# Patient Record
Sex: Male | Born: 1965
Health system: Southern US, Community
[De-identification: ages and names within clinical notes are randomized; demographics above are authoritative.]

## PROBLEM LIST (undated history)

## (undated) DIAGNOSIS — M5090 Cervical disc disorder, unspecified, unspecified cervical region: Secondary | ICD-10-CM

## (undated) DIAGNOSIS — K219 Gastro-esophageal reflux disease without esophagitis: Secondary | ICD-10-CM

## (undated) DIAGNOSIS — M47812 Spondylosis without myelopathy or radiculopathy, cervical region: Secondary | ICD-10-CM

## (undated) DIAGNOSIS — J309 Allergic rhinitis, unspecified: Secondary | ICD-10-CM

## (undated) DIAGNOSIS — E781 Pure hyperglyceridemia: Secondary | ICD-10-CM

## (undated) DIAGNOSIS — E291 Testicular hypofunction: Secondary | ICD-10-CM

## (undated) DIAGNOSIS — M549 Dorsalgia, unspecified: Secondary | ICD-10-CM

## (undated) DIAGNOSIS — E785 Hyperlipidemia, unspecified: Secondary | ICD-10-CM

## (undated) DIAGNOSIS — R51 Headache: Secondary | ICD-10-CM

## (undated) DIAGNOSIS — G8929 Other chronic pain: Secondary | ICD-10-CM

## (undated) HISTORY — DX: Dorsalgia, unspecified: M54.9

## (undated) HISTORY — PX: EPIDURAL BLOCK INJECTION: SHX1516

## (undated) HISTORY — DX: Headache: R51

## (undated) HISTORY — DX: Gastro-esophageal reflux disease without esophagitis: K21.9

## (undated) HISTORY — DX: Cervical disc disorder, unspecified, unspecified cervical region: M50.90

## (undated) HISTORY — PX: LUMBAR LAMINECTOMY: SHX95

## (undated) HISTORY — DX: Spondylosis without myelopathy or radiculopathy, cervical region: M47.812

## (undated) HISTORY — PX: LUMBAR FUSION: SHX111

## (undated) HISTORY — DX: Other chronic pain: G89.29

## (undated) HISTORY — DX: Pure hyperglyceridemia: E78.1

## (undated) HISTORY — DX: Hyperlipidemia, unspecified: E78.5

## (undated) HISTORY — DX: Allergic rhinitis, unspecified: J30.9

## (undated) HISTORY — DX: Testicular hypofunction: E29.1

---

## 1995-11-08 HISTORY — PX: MOUTH SURGERY: SHX715

## 1999-07-11 ENCOUNTER — Ambulatory Visit (HOSPITAL_COMMUNITY): Admission: RE | Admit: 1999-07-11 | Discharge: 1999-07-11 | Payer: Self-pay | Admitting: Neurosurgery

## 1999-07-11 ENCOUNTER — Encounter: Payer: Self-pay | Admitting: Neurosurgery

## 1999-07-29 ENCOUNTER — Encounter: Admission: RE | Admit: 1999-07-29 | Discharge: 1999-08-12 | Payer: Self-pay | Admitting: Internal Medicine

## 2000-06-10 ENCOUNTER — Encounter: Payer: Self-pay | Admitting: Neurosurgery

## 2000-06-10 ENCOUNTER — Ambulatory Visit (HOSPITAL_COMMUNITY): Admission: RE | Admit: 2000-06-10 | Discharge: 2000-06-10 | Payer: Self-pay | Admitting: Neurosurgery

## 2000-10-14 ENCOUNTER — Encounter: Payer: Self-pay | Admitting: Neurosurgery

## 2000-10-14 ENCOUNTER — Ambulatory Visit (HOSPITAL_COMMUNITY): Admission: RE | Admit: 2000-10-14 | Discharge: 2000-10-14 | Payer: Self-pay | Admitting: Neurosurgery

## 2001-08-13 ENCOUNTER — Emergency Department (HOSPITAL_COMMUNITY): Admission: EM | Admit: 2001-08-13 | Discharge: 2001-08-13 | Payer: Self-pay | Admitting: Emergency Medicine

## 2002-09-12 ENCOUNTER — Encounter: Payer: Self-pay | Admitting: *Deleted

## 2002-09-12 ENCOUNTER — Encounter: Admission: RE | Admit: 2002-09-12 | Discharge: 2002-09-12 | Payer: Self-pay | Admitting: *Deleted

## 2004-09-07 ENCOUNTER — Ambulatory Visit: Payer: Self-pay | Admitting: Internal Medicine

## 2004-09-28 ENCOUNTER — Ambulatory Visit: Payer: Self-pay | Admitting: Internal Medicine

## 2004-11-15 ENCOUNTER — Ambulatory Visit: Payer: Self-pay | Admitting: Internal Medicine

## 2005-01-11 ENCOUNTER — Ambulatory Visit: Payer: Self-pay | Admitting: Internal Medicine

## 2005-01-18 ENCOUNTER — Ambulatory Visit: Payer: Self-pay | Admitting: Internal Medicine

## 2005-08-16 ENCOUNTER — Ambulatory Visit: Payer: Self-pay | Admitting: Neurosurgery

## 2005-12-06 ENCOUNTER — Ambulatory Visit: Payer: Self-pay | Admitting: Internal Medicine

## 2006-01-30 ENCOUNTER — Ambulatory Visit: Payer: Self-pay | Admitting: Internal Medicine

## 2006-02-06 ENCOUNTER — Ambulatory Visit: Payer: Self-pay | Admitting: Internal Medicine

## 2007-02-08 ENCOUNTER — Ambulatory Visit: Payer: Self-pay | Admitting: Internal Medicine

## 2007-04-30 ENCOUNTER — Ambulatory Visit: Payer: Self-pay | Admitting: Internal Medicine

## 2007-05-29 ENCOUNTER — Telehealth: Payer: Self-pay | Admitting: *Deleted

## 2007-06-13 ENCOUNTER — Encounter: Payer: Self-pay | Admitting: Internal Medicine

## 2007-07-30 ENCOUNTER — Telehealth: Payer: Self-pay | Admitting: *Deleted

## 2007-11-26 ENCOUNTER — Ambulatory Visit: Payer: Self-pay | Admitting: Internal Medicine

## 2007-11-26 DIAGNOSIS — M5137 Other intervertebral disc degeneration, lumbosacral region: Secondary | ICD-10-CM | POA: Insufficient documentation

## 2007-11-26 DIAGNOSIS — M549 Dorsalgia, unspecified: Secondary | ICD-10-CM

## 2007-11-26 DIAGNOSIS — G8929 Other chronic pain: Secondary | ICD-10-CM | POA: Insufficient documentation

## 2007-11-26 DIAGNOSIS — N200 Calculus of kidney: Secondary | ICD-10-CM | POA: Insufficient documentation

## 2007-11-26 DIAGNOSIS — E785 Hyperlipidemia, unspecified: Secondary | ICD-10-CM | POA: Insufficient documentation

## 2007-11-26 LAB — CONVERTED CEMR LAB
Bilirubin Urine: NEGATIVE
Glucose, Urine, Semiquant: NEGATIVE
Ketones, urine, test strip: NEGATIVE
Nitrite: NEGATIVE
Specific Gravity, Urine: >=1.03
Urobilinogen, UA: 0.2
WBC Urine, dipstick: NEGATIVE
pH: 5.5

## 2007-11-28 ENCOUNTER — Ambulatory Visit: Payer: Self-pay | Admitting: Cardiovascular Disease

## 2007-11-29 ENCOUNTER — Telehealth: Payer: Self-pay | Admitting: *Deleted

## 2007-11-29 ENCOUNTER — Encounter: Payer: Self-pay | Admitting: Internal Medicine

## 2007-11-30 ENCOUNTER — Telehealth (INDEPENDENT_AMBULATORY_CARE_PROVIDER_SITE_OTHER): Payer: Self-pay | Admitting: *Deleted

## 2007-12-18 ENCOUNTER — Encounter: Payer: Self-pay | Admitting: Internal Medicine

## 2008-02-25 ENCOUNTER — Telehealth (INDEPENDENT_AMBULATORY_CARE_PROVIDER_SITE_OTHER): Payer: Self-pay | Admitting: *Deleted

## 2008-05-26 ENCOUNTER — Telehealth: Payer: Self-pay | Admitting: Internal Medicine

## 2008-08-05 ENCOUNTER — Telehealth: Payer: Self-pay | Admitting: Internal Medicine

## 2008-08-19 ENCOUNTER — Ambulatory Visit: Payer: Self-pay | Admitting: Internal Medicine

## 2008-08-19 LAB — CONVERTED CEMR LAB
ALT: 29 units/L (ref 0–53)
AST: 25 units/L (ref 0–37)
Albumin: 3.8 g/dL (ref 3.5–5.2)
Alkaline Phosphatase: 67 units/L (ref 39–117)
BUN: 14 mg/dL (ref 6–23)
Basophils Absolute: 0 10*3/uL (ref 0.0–0.1)
Basophils Relative: 0.2 % (ref 0.0–3.0)
Bilirubin Urine: NEGATIVE
Bilirubin, Direct: 0.1 mg/dL (ref 0.0–0.3)
Blood in Urine, dipstick: NEGATIVE
CO2: 34 meq/L — ABNORMAL HIGH (ref 19–32)
Calcium: 9.2 mg/dL (ref 8.4–10.5)
Chloride: 102 meq/L (ref 96–112)
Cholesterol: 193 mg/dL (ref 0–200)
Creatinine, Ser: 0.8 mg/dL (ref 0.4–1.5)
Direct LDL: 60.5 mg/dL
Eosinophils Absolute: 0.1 10*3/uL (ref 0.0–0.7)
Eosinophils Relative: 2.2 % (ref 0.0–5.0)
GFR calc Af Amer: 135 mL/min
GFR calc non Af Amer: 112 mL/min
Glucose, Bld: 132 mg/dL — ABNORMAL HIGH (ref 70–99)
Glucose, Urine, Semiquant: NEGATIVE
HCT: 35.9 % — ABNORMAL LOW (ref 39.0–52.0)
HDL: 24.9 mg/dL — ABNORMAL LOW (ref >39.0)
Hemoglobin: 12.3 g/dL — ABNORMAL LOW (ref 13.0–17.0)
Lymphocytes Relative: 33.2 % (ref 12.0–46.0)
MCHC: 34.2 g/dL (ref 30.0–36.0)
MCV: 91.3 fL (ref 78.0–100.0)
Monocytes Absolute: 0.5 10*3/uL (ref 0.1–1.0)
Monocytes Relative: 7.8 % (ref 3.0–12.0)
Neutro Abs: 3.4 10*3/uL (ref 1.4–7.7)
Neutrophils Relative %: 56.6 % (ref 43.0–77.0)
Nitrite: NEGATIVE
Platelets: 205 10*3/uL (ref 150–400)
Potassium: 3.7 meq/L (ref 3.5–5.1)
Protein, U semiquant: NEGATIVE
RBC: 3.93 M/uL — ABNORMAL LOW (ref 4.22–5.81)
RDW: 13.7 % (ref 11.5–14.6)
Sodium: 143 meq/L (ref 135–145)
Specific Gravity, Urine: 1.02
TSH: 1.32 microintl units/mL (ref 0.35–5.50)
Total Bilirubin: 0.4 mg/dL (ref 0.3–1.2)
Total CHOL/HDL Ratio: 7.8
Total Protein: 6.8 g/dL (ref 6.0–8.3)
Triglycerides: 599 mg/dL (ref 0–149)
Urobilinogen, UA: 0.2
VLDL: 120 mg/dL — ABNORMAL HIGH (ref 0–40)
WBC Urine, dipstick: NEGATIVE
WBC: 6 10*3/uL (ref 4.5–10.5)
pH: 6

## 2008-08-26 ENCOUNTER — Ambulatory Visit: Payer: Self-pay | Admitting: Internal Medicine

## 2008-08-26 DIAGNOSIS — R51 Headache: Secondary | ICD-10-CM | POA: Insufficient documentation

## 2008-08-26 DIAGNOSIS — R519 Headache, unspecified: Secondary | ICD-10-CM | POA: Insufficient documentation

## 2008-11-18 ENCOUNTER — Ambulatory Visit: Payer: Self-pay | Admitting: Internal Medicine

## 2008-11-18 LAB — CONVERTED CEMR LAB
Basophils Absolute: 0.1 10*3/uL (ref 0.0–0.1)
Basophils Relative: 0.8 % (ref 0.0–3.0)
Cholesterol: 182 mg/dL (ref 0–200)
Direct LDL: 63.5 mg/dL
Eosinophils Absolute: 0.2 10*3/uL (ref 0.0–0.7)
Eosinophils Relative: 2.1 % (ref 0.0–5.0)
HCT: 38.2 % — ABNORMAL LOW (ref 39.0–52.0)
HDL: 21.7 mg/dL — ABNORMAL LOW (ref >39.0)
Hemoglobin: 13 g/dL (ref 13.0–17.0)
Lymphocytes Relative: 28.2 % (ref 12.0–46.0)
MCHC: 34 g/dL (ref 30.0–36.0)
MCV: 89.9 fL (ref 78.0–100.0)
Monocytes Absolute: 0.6 10*3/uL (ref 0.1–1.0)
Monocytes Relative: 8.4 % (ref 3.0–12.0)
Neutro Abs: 4.4 10*3/uL (ref 1.4–7.7)
Neutrophils Relative %: 60.5 % (ref 43.0–77.0)
Platelets: 209 10*3/uL (ref 150–400)
RBC: 4.25 M/uL (ref 4.22–5.81)
RDW: 13.2 % (ref 11.5–14.6)
Total CHOL/HDL Ratio: 8.4
Triglycerides: 546 mg/dL (ref 0–149)
VLDL: 109 mg/dL — ABNORMAL HIGH (ref 0–40)
WBC: 7.4 10*3/uL (ref 4.5–10.5)

## 2008-11-25 ENCOUNTER — Ambulatory Visit: Payer: Self-pay | Admitting: Internal Medicine

## 2008-11-25 DIAGNOSIS — E8881 Metabolic syndrome: Secondary | ICD-10-CM | POA: Insufficient documentation

## 2008-11-25 DIAGNOSIS — F17200 Nicotine dependence, unspecified, uncomplicated: Secondary | ICD-10-CM | POA: Insufficient documentation

## 2009-02-16 ENCOUNTER — Ambulatory Visit: Payer: Self-pay | Admitting: Internal Medicine

## 2009-02-16 LAB — CONVERTED CEMR LAB
ALT: 21 units/L (ref 0–53)
AST: 22 units/L (ref 0–37)
Albumin: 3.6 g/dL (ref 3.5–5.2)
Alkaline Phosphatase: 79 units/L (ref 39–117)
Bilirubin, Direct: 0 mg/dL (ref 0.0–0.3)
Cholesterol: 257 mg/dL — ABNORMAL HIGH (ref 0–200)
Direct LDL: 63.6 mg/dL
HDL: 14.3 mg/dL — ABNORMAL LOW (ref >39.00)
Total Bilirubin: 0.5 mg/dL (ref 0.3–1.2)
Total CHOL/HDL Ratio: 18
Total Protein: 6.8 g/dL (ref 6.0–8.3)
Triglycerides: 1433 mg/dL — ABNORMAL HIGH (ref 0.0–149.0)
VLDL: 286.6 mg/dL — ABNORMAL HIGH (ref 0.0–40.0)

## 2009-02-24 ENCOUNTER — Ambulatory Visit: Payer: Self-pay | Admitting: Internal Medicine

## 2009-02-24 DIAGNOSIS — K219 Gastro-esophageal reflux disease without esophagitis: Secondary | ICD-10-CM | POA: Insufficient documentation

## 2009-04-10 ENCOUNTER — Telehealth: Payer: Self-pay | Admitting: Internal Medicine

## 2009-05-20 ENCOUNTER — Ambulatory Visit: Payer: Self-pay | Admitting: Internal Medicine

## 2009-05-20 LAB — CONVERTED CEMR LAB
Bilirubin, Direct: 0 mg/dL (ref 0.0–0.3)
Direct LDL: 91.1 mg/dL
HDL: 25.9 mg/dL — ABNORMAL LOW (ref >39.00)
Total Bilirubin: 0.4 mg/dL (ref 0.3–1.2)
Total CHOL/HDL Ratio: 7
Triglycerides: 394 mg/dL — ABNORMAL HIGH (ref 0.0–149.0)
VLDL: 78.8 mg/dL — ABNORMAL HIGH (ref 0.0–40.0)

## 2009-05-27 ENCOUNTER — Ambulatory Visit: Payer: Self-pay | Admitting: Internal Medicine

## 2009-08-24 ENCOUNTER — Ambulatory Visit: Payer: Self-pay | Admitting: Internal Medicine

## 2009-08-24 LAB — CONVERTED CEMR LAB
CO2: 35 meq/L — ABNORMAL HIGH (ref 19–32)
Calcium: 9.2 mg/dL (ref 8.4–10.5)
Chloride: 102 meq/L (ref 96–112)
Cholesterol: 200 mg/dL (ref 0–200)
Direct LDL: 112.3 mg/dL
Glucose, Bld: 74 mg/dL (ref 70–99)
HDL: 26.6 mg/dL — ABNORMAL LOW (ref >39.00)
Potassium: 4.7 meq/L (ref 3.5–5.1)
Sodium: 144 meq/L (ref 135–145)

## 2009-11-18 ENCOUNTER — Ambulatory Visit: Payer: Self-pay | Admitting: Internal Medicine

## 2009-11-18 DIAGNOSIS — M1710 Unilateral primary osteoarthritis, unspecified knee: Secondary | ICD-10-CM | POA: Insufficient documentation

## 2009-11-18 DIAGNOSIS — L218 Other seborrheic dermatitis: Secondary | ICD-10-CM | POA: Insufficient documentation

## 2010-02-16 ENCOUNTER — Ambulatory Visit: Payer: Self-pay | Admitting: Internal Medicine

## 2010-05-18 ENCOUNTER — Ambulatory Visit: Payer: Self-pay | Admitting: Internal Medicine

## 2010-08-18 ENCOUNTER — Ambulatory Visit: Payer: Self-pay | Admitting: Internal Medicine

## 2010-08-18 LAB — CONVERTED CEMR LAB
BUN: 12 mg/dL (ref 6–23)
CO2: 33 meq/L — ABNORMAL HIGH (ref 19–32)
Calcium: 9.5 mg/dL (ref 8.4–10.5)
Glucose, Bld: 92 mg/dL (ref 70–99)
HDL: 25.9 mg/dL — ABNORMAL LOW (ref >39.00)
Hgb A1c MFr Bld: 6.5 % (ref 4.6–6.5)
Sodium: 139 meq/L (ref 135–145)

## 2010-11-19 ENCOUNTER — Ambulatory Visit
Admission: RE | Admit: 2010-11-19 | Discharge: 2010-11-19 | Payer: Self-pay | Source: Home / Self Care | Attending: Internal Medicine | Admitting: Internal Medicine

## 2010-11-19 DIAGNOSIS — M7710 Lateral epicondylitis, unspecified elbow: Secondary | ICD-10-CM | POA: Insufficient documentation

## 2010-12-09 NOTE — Assessment & Plan Note (Signed)
Summary: 3 MONTH ROV/NJR   Vital Signs:  Patient profile:   45 year old male Height:      68 inches Weight:      211 pounds BMI:     32.20 Temp:     98.2 degrees F oral Pulse rate:   80 / minute Resp:     14 per minute BP sitting:   134 / 80  (left arm)  Vitals Entered By: Willy Eddy, LPN (August 18, 2010 11:03 AM)  Nutrition Counseling: Patient's BMI is greater than 25 and therefore counseled on weight management options. CC: RO MED REVIEW, Lipid Management Is Patient Diabetic? No   Primary Care Tarius Stangelo:  Stacie Glaze MD  CC:  RO MED REVIEW and Lipid Management.  History of Present Illness: knee pain is better  back pain is stable on current medications the pt needs refills on his pain medications Head ache pattern is stable smoking counsilling offered  to help quit GERD stable but recurs when stops PPIs   Lipid Management History:      Positive NCEP/ATP III risk factors include male age 96 years old or older, HDL cholesterol less than 40, family history for ischemic heart disease (males less than 57 years old), and current tobacco user.  Negative NCEP/ATP III risk factors include non-hypertensive, no ASHD (atherosclerotic heart disease), no prior stroke/TIA, no peripheral vascular disease, and no history of aortic aneurysm.     Preventive Screening-Counseling & Management  Alcohol-Tobacco     Smoking Status: current     Smoking Cessation Counseling: yes     Smoke Cessation Stage: contemplative     Packs/Day: .5     Passive Smoke Exposure: yes     Tobacco Counseling: to quit use of tobacco products  Problems Prior to Update: 1)  Loc Osteoarthros Not Spec Prim/sec Lower Leg  (ICD-715.36) 2)  Other Seborrheic Dermatitis  (ICD-690.18) 3)  Gastroesophageal Reflux Disease  (ICD-530.81) 4)  Tobacco Abuse  (ICD-305.1) 5)  Dysmetabolic Syndrome X  (ICD-277.7) 6)  Headache  (ICD-784.0) 7)  Physical Examination  (ICD-V70.0) 8)  Nephrolithiasis, Recurrent   (ICD-592.0) 9)  Disc Disease, Lumbar  (ICD-722.52) 10)  Back Pain, Chronic  (ICD-724.5) 11)  Hyperlipidemia  (ICD-272.4)  Current Problems (verified): 1)  Loc Osteoarthros Not Spec Prim/sec Lower Leg  (ICD-715.36) 2)  Other Seborrheic Dermatitis  (ICD-690.18) 3)  Gastroesophageal Reflux Disease  (ICD-530.81) 4)  Tobacco Abuse  (ICD-305.1) 5)  Dysmetabolic Syndrome X  (ICD-277.7) 6)  Headache  (ICD-784.0) 7)  Physical Examination  (ICD-V70.0) 8)  Nephrolithiasis, Recurrent  (ICD-592.0) 9)  Disc Disease, Lumbar  (ICD-722.52) 10)  Back Pain, Chronic  (ICD-724.5) 11)  Hyperlipidemia  (ICD-272.4)  Medications Prior to Update: 1)  Fiorinal/codeine #3 50-325-40-30 Mg  Caps (Butalbital-Asa-Caff-Codeine) .... Take Two Capsules By Mouth As Needed  For Headachef 2)  Methadose 40 Mg  Tbso (Methadone Hcl) .... One Tab Four Times A Day 3)  Oxycodone Hcl 30 Mg  Tabs (Oxycodone Hcl) .Marland Kitchen.. 1 Every 4-6 Hours As Needed 4)  Fentanyl 75 Mcg/hr  Pt72 (Fentanyl) .... Change Every 72 Hours 5)  Alprazolam 2 Mg  Tb24 (Alprazolam) .Marland Kitchen.. 1 Two Times A Day 6)  Prilosec Otc 20 Mg Tbec (Omeprazole Magnesium) .... One By Mouth Daily 7)  Trilipix 135 Mg Cpdr (Choline Fenofibrate) .... One By Mouth Daily 8)  Betamethasone Dipropionate 0.05 % Lotn (Betamethasone Dipropionate) .... Aplly Mixture To Dry Skin Two Times A Day  1/4 Betamethasone Loton With 3/4 Eucerin  Lotion 9)  Vitamin D3 400 Unit Tabs (Cholecalciferol) .Marland Kitchen.. 1 Once Daily 10)  Vitamin E 400 Unit Caps (Vitamin E) .Marland Kitchen.. 1 Once Daily  Current Medications (verified): 1)  Fiorinal/codeine #3 50-325-40-30 Mg  Caps (Butalbital-Asa-Caff-Codeine) .... Take Two Capsules By Mouth As Needed  For Headachef 2)  Methadose 40 Mg  Tbso (Methadone Hcl) .... One Tab Four Times A Day 3)  Oxycodone Hcl 30 Mg  Tabs (Oxycodone Hcl) .Marland Kitchen.. 1 Every 4-6 Hours As Needed 4)  Fentanyl 75 Mcg/hr  Pt72 (Fentanyl) .... Change Every 72 Hours 5)  Alprazolam 2 Mg  Tb24 (Alprazolam) .Marland Kitchen.. 1 Two  Times A Day 6)  Prilosec Otc 20 Mg Tbec (Omeprazole Magnesium) .... One By Mouth Daily 7)  Fenofibrate Micronized 134 Mg Caps (Fenofibrate Micronized) .Marland Kitchen.. 1 Once Daily 8)  Betamethasone Dipropionate 0.05 % Lotn (Betamethasone Dipropionate) .... Aplly Mixture To Dry Skin Two Times A Day  1/4 Betamethasone Loton With 3/4 Eucerin Lotion 9)  Vitamin D3 400 Unit Tabs (Cholecalciferol) .Marland Kitchen.. 1 Once Daily 10)  Vitamin E 400 Unit Caps (Vitamin E) .Marland Kitchen.. 1 Once Daily  Allergies (verified): No Known Drug Allergies  Past History:  Family History: Last updated: 11/26/2007 Family History Hypertension  Social History: Last updated: 11/26/2007 Occupation: Married Former Smoker  Risk Factors: Exercise: yes (08/26/2008)  Risk Factors: Smoking Status: current (08/18/2010) Packs/Day: .5 (08/18/2010) Passive Smoke Exposure: yes (08/18/2010)  Past medical, surgical, family and social histories (including risk factors) reviewed, and no changes noted (except as noted below).  Past Medical History: Reviewed history from 11/25/2008 and no changes required. chronic back pain disc disease Hyperlipidemia cervical arthritis Headache HIGH TRIGLYCERDES  Past Surgical History: Reviewed history from 08/26/2008 and no changes required. Dr Channing Mutters epidural steroids Lumbar laminectomy Lumbar fusion  Family History: Reviewed history from 11/26/2007 and no changes required. Family History Hypertension  Social History: Reviewed history from 11/26/2007 and no changes required. Occupation: Married Former Smoker  Review of Systems  The patient denies anorexia, fever, weight loss, weight gain, vision loss, decreased hearing, hoarseness, chest pain, syncope, dyspnea on exertion, peripheral edema, prolonged cough, headaches, hemoptysis, abdominal pain, melena, hematochezia, severe indigestion/heartburn, hematuria, incontinence, genital sores, muscle weakness, suspicious skin lesions, transient blindness,  difficulty walking, depression, unusual weight change, abnormal bleeding, enlarged lymph nodes, angioedema, and breast masses.    Physical Exam  General:  alert and overweight-appearing.   Head:  normocephalic and atraumatic.   Eyes:  pupils equal and pupils round.   Ears:  R ear normal and L ear normal.   Nose:  no external deformity and no nasal discharge.   Neck:  full ROM and nuchal rigidity.   Lungs:  normal respiratory effort and R wheezes THAT CLEAR WITH COUGH  stable Heart:  normal rate and regular rhythm.   Abdomen:  soft, non-tender, and normal bowel sounds.   Msk:  joint tenderness, and joint swelling resolved in  right knee   Impression & Recommendations:  Problem # 1:  LOC OSTEOARTHROS NOT SPEC PRIM/SEC LOWER LEG (ICD-715.36) resolved His updated medication list for this problem includes:    Fiorinal/codeine #3 50-325-40-30 Mg Caps (Butalbital-asa-caff-codeine) .Marland Kitchen... Take two capsules by mouth as needed  for headachef    Methadose 40 Mg Tbso (Methadone hcl) ..... One tab four times a day    Oxycodone Hcl 30 Mg Tabs (Oxycodone hcl) .Marland Kitchen... 1 every 4-6 hours as needed    Fentanyl 75 Mcg/hr Pt72 (Fentanyl) .Marland Kitchen... Change every 72 hours  Problem # 2:  BACK  PAIN, CHRONIC (ICD-724.5) Assessment: Unchanged  His updated medication list for this problem includes:    Fiorinal/codeine #3 50-325-40-30 Mg Caps (Butalbital-asa-caff-codeine) .Marland Kitchen... Take two capsules by mouth as needed  for headachef    Methadose 40 Mg Tbso (Methadone hcl) ..... One tab four times a day    Oxycodone Hcl 30 Mg Tabs (Oxycodone hcl) .Marland Kitchen... 1 every 4-6 hours as needed    Fentanyl 75 Mcg/hr Pt72 (Fentanyl) .Marland Kitchen... Change every 72 hours  Discussed use of moist heat or ice, modified activities, medications, and stretching/strengthening exercises. Back care instructions given. To be seen in 2 weeks if no improvement; sooner if worsening of symptoms.   Problem # 3:  HYPERLIPIDEMIA (ICD-272.4) Assessment:  Unchanged  His updated medication list for this problem includes:    Fenofibrate Micronized 134 Mg Caps (Fenofibrate micronized) .Marland Kitchen... 1 once daily  Orders: TLB-Cholesterol, HDL (83718-HDL) TLB-Cholesterol, Direct LDL (83721-DIRLDL) TLB-Cholesterol, Total (82465-CHO) TLB-TSH (Thyroid Stimulating Hormone) (84443-TSH)  Labs Reviewed: SGOT: 23 (05/20/2009)   SGPT: 17 (05/20/2009)  Lipid Goals: Chol Goal: 200 (02/24/2009)   HDL Goal: 40 (02/24/2009)   LDL Goal: 130 (02/24/2009)   TG Goal: 150 (02/24/2009)  Prior 10 Yr Risk Heart Disease: Not enough information (02/24/2009)   HDL:26.60 (08/24/2009), 25.90 (05/20/2009)  LDL:DEL (11/18/2008), DEL (08/19/2008)  Chol:200 (08/24/2009), 191 (05/20/2009)  Trig:394.0 (05/20/2009), 1433.0 (02/16/2009)  Complete Medication List: 1)  Fiorinal/codeine #3 50-325-40-30 Mg Caps (Butalbital-asa-caff-codeine) .... Take two capsules by mouth as needed  for headachef 2)  Methadose 40 Mg Tbso (Methadone hcl) .... One tab four times a day 3)  Oxycodone Hcl 30 Mg Tabs (Oxycodone hcl) .Marland Kitchen.. 1 every 4-6 hours as needed 4)  Fentanyl 75 Mcg/hr Pt72 (Fentanyl) .... Change every 72 hours 5)  Alprazolam 2 Mg Tb24 (Alprazolam) .Marland Kitchen.. 1 two times a day 6)  Prilosec Otc 20 Mg Tbec (Omeprazole magnesium) .... One by mouth daily 7)  Fenofibrate Micronized 134 Mg Caps (Fenofibrate micronized) .Marland Kitchen.. 1 once daily 8)  Betamethasone Dipropionate 0.05 % Lotn (Betamethasone dipropionate) .... Aplly mixture to dry skin two times a day  1/4 betamethasone loton with 3/4 eucerin lotion 9)  Vitamin D3 400 Unit Tabs (Cholecalciferol) .Marland Kitchen.. 1 once daily 10)  Vitamin E 400 Unit Caps (Vitamin e) .Marland Kitchen.. 1 once daily  Other Orders: Venipuncture (16109) TLB-A1C / Hgb A1C (Glycohemoglobin) (83036-A1C) TLB-BMP (Basic Metabolic Panel-BMET) (80048-METABOL)  Lipid Assessment/Plan:      Based on NCEP/ATP III, the patient's risk factor category is "2 or more risk factors and a calculated 10 year CAD  risk of < 20%".  The patient's lipid goals are as follows: Total cholesterol goal is 200; LDL cholesterol goal is 130; HDL cholesterol goal is 40; Triglyceride goal is 150.    Patient Instructions: 1)  Please schedule a follow-up appointment in 3 months. Prescriptions: FENOFIBRATE MICRONIZED 134 MG CAPS (FENOFIBRATE MICRONIZED) 1 once daily  #30 x 11   Entered and Authorized by:   Stacie Glaze MD   Signed by:   Stacie Glaze MD on 08/31/2010   Method used:   Electronically to        [facility]* (retail)       [address]       [place], [place]  [address]       Ph: [phone]       Fax: [phone]   RxID:   [id number] TRILIPIX 135 MG CPDR (CHOLINE FENOFIBRATE) ONE by mouth DAILY  #30  x 6   Entered by:   Willy Eddy, LPN   Authorized by:   Stacie Glaze MD   Signed by:   Willy Eddy, LPN on 65/78/4696   Method used:   Electronically to        [facility]* (retail)       [address]       [place], [place]  [address]       Ph: [phone]       Fax: [phone]   RxID:   [id number] FENTANYL 75 MCG/HR  PT72 (FENTANYL) change every 72 hours  #10 x 0   Entered by:   Willy Eddy, LPN   Authorized by:   Stacie Glaze MD   Signed by:   Willy Eddy, LPN on 29/51/8841   Method used:   Print then Give to Patient   RxID:   [id number] OXYCODONE HCL 30 MG  TABS (OXYCODONE HCL) 1 every 4-6 hours as needed  #180 x 0   Entered by:   Willy Eddy, LPN   Authorized by:   Stacie Glaze MD   Signed by:   Willy Eddy, LPN on 57/32/2025   Method used:   Print then Give to Patient   RxID:   [id number] METHADOSE 40 MG  TBSO (METHADONE HCL) one tab four times a day  #120 x 0   Entered by:   Willy Eddy, LPN   Authorized by:   Stacie Glaze MD   Signed by:   Willy Eddy, LPN on 61/60/7371   Method used:   Print then Give to Patient   RxID:   [id number] FENTANYL 75 MCG/HR  PT72  (FENTANYL) change every 72 hours  #10 x 0   Entered by:   Willy Eddy, LPN   Authorized by:   Stacie Glaze MD   Signed by:   Willy Eddy, LPN on 09/38/1829   Method used:   Print then Give to Patient   RxID:   [id number] OXYCODONE HCL 30 MG  TABS (OXYCODONE HCL) 1 every 4-6 hours as needed  #180 x 0   Entered by:   Willy Eddy, LPN   Authorized by:   Stacie Glaze MD   Signed by:   Willy Eddy, LPN on 51/12/5850   Method used:   Print then Give to Patient   RxID:   [id number] METHADOSE 40 MG  TBSO (METHADONE HCL) one tab four times a day  #120 x 0   Entered by:   Willy Eddy, LPN   Authorized by:   Stacie Glaze MD   Signed by:   Willy Eddy, LPN on 40/06/6760   Method used:   Print then Give to Patient   RxID:   [id number] ALPRAZOLAM 2 MG  TB24 (ALPRAZOLAM) 1 two times a day  #60 x 3   Entered by:   Willy Eddy, LPN   Authorized by:   Stacie Glaze MD   Signed by:   Willy Eddy, LPN on 83/38/2505   Method used:   Print then Give to Patient   RxID:   [id number] FENTANYL 75 MCG/HR  PT72 (FENTANYL) change every 72 hours  #10 x 0   Entered by:   Willy Eddy, LPN   Authorized by:   Stacie Glaze MD   Signed by:   Rushie Goltz  Artelia Laroche, LPN on 64/40/3474   Method used:   Print then Give to Patient   RxID:   [id number] OXYCODONE HCL 30 MG  TABS (OXYCODONE HCL) 1 every 4-6 hours as needed  #180 x 0   Entered by:   Willy Eddy, LPN   Authorized by:   Stacie Glaze MD   Signed by:   Willy Eddy, LPN on 18/84/1660   Method used:   Print then Give to Patient   RxID:   [id number] METHADOSE 40 MG  TBSO (METHADONE HCL) one tab four times a day  #120 x 0   Entered by:   Willy Eddy, LPN   Authorized by:   Stacie Glaze MD   Signed by:   Willy Eddy, LPN on 22/12/5425   Method used:   Print then Give to Patient   RxID:   [id number] FIORINAL/CODEINE #3 07-371-06-26 MG   CAPS (BUTALBITAL-ASA-CAFF-CODEINE) take two capsules by mouth as needed  for headachef  #60 x 3   Entered by:   Willy Eddy, LPN   Authorized by:   Stacie Glaze MD   Signed by:   Willy Eddy, LPN on 94/85/4627   Method used:   Print then Give to Patient   RxID:   [id number]

## 2010-12-09 NOTE — Assessment & Plan Note (Signed)
Summary: 3 mnth rov//slm   Vital Signs:  Patient profile:   45 year old male Height:      68 inches Weight:      216 pounds BMI:     32.96 Temp:     98.2 degrees F oral Pulse rate:   84 / minute Resp:     14 per minute BP sitting:   136 / 84  (left arm)  Vitals Entered By: Willy Eddy, LPN (November 19, 2010 2:34 PM) CC: roa-c/o  "upset stomach" after taking fenofibrate Is Patient Diabetic? No   Primary Care Provider:  Stacie Glaze MD  CC:  roa-c/o  "upset stomach" after taking fenofibrate.  History of Present Illness: risk of DM discussed. Pt quit soft drinks but is still gaining weight concerned about AODM He is confused about sugars and carbohydrates... he reports hypoglycemia  these events of hypoglycemia primarily occur after he has had sugared foods such as a soft drink he has taken fenofibrate the past for his hyperlipidemia he states that he cannot tolerate this due to gastric distention and gas.  He has acute epicodylitis in the left arm. he works in a [employer] which have exacerbated his epicondylitis  Preventive Screening-Counseling & Management  Alcohol-Tobacco     Smoking Status: current     Smoking Cessation Counseling: yes     Smoke Cessation Stage: contemplative     Packs/Day: .5     Passive Smoke Exposure: yes     Tobacco Counseling: to quit use of tobacco products  Problems Prior to Update: 1)  Epicondylitis, Left  (ICD-726.32) 2)  Loc Osteoarthros Not Spec Prim/sec Lower Leg  (ICD-715.36) 3)  Other Seborrheic Dermatitis  (ICD-690.18) 4)  Gastroesophageal Reflux Disease  (ICD-530.81) 5)  Tobacco Abuse  (ICD-305.1) 6)  Dysmetabolic Syndrome X  (ICD-277.7) 7)  Headache  (ICD-784.0) 8)  Physical Examination  (ICD-V70.0) 9)  Nephrolithiasis, Recurrent  (ICD-592.0) 10)  Disc Disease, Lumbar  (ICD-722.52) 11)  Back Pain, Chronic  (ICD-724.5) 12)  Hyperlipidemia  (ICD-272.4)  Current Problems (verified): 1)  Loc Osteoarthros  Not Spec Prim/sec Lower Leg  (ICD-715.36) 2)  Other Seborrheic Dermatitis  (ICD-690.18) 3)  Gastroesophageal Reflux Disease  (ICD-530.81) 4)  Tobacco Abuse  (ICD-305.1) 5)  Dysmetabolic Syndrome X  (ICD-277.7) 6)  Headache  (ICD-784.0) 7)  Physical Examination  (ICD-V70.0) 8)  Nephrolithiasis, Recurrent  (ICD-592.0) 9)  Disc Disease, Lumbar  (ICD-722.52) 10)  Back Pain, Chronic  (ICD-724.5) 11)  Hyperlipidemia  (ICD-272.4)  Medications Prior to Update: 1)  Fiorinal/codeine #3 50-325-40-30 Mg  Caps (Butalbital-Asa-Caff-Codeine) .... Take Two Capsules By Mouth As Needed  For Headachef 2)  Methadose 40 Mg  Tbso (Methadone Hcl) .... One Tab Four Times A Day 3)  Oxycodone Hcl 30 Mg  Tabs (Oxycodone Hcl) .Marland Kitchen.. 1 Every 4-6 Hours As Needed 4)  Fentanyl 75 Mcg/hr  Pt72 (Fentanyl) .... Change Every 72 Hours 5)  Alprazolam 2 Mg  Tb24 (Alprazolam) .Marland Kitchen.. 1 Two Times A Day 6)  Prilosec Otc 20 Mg Tbec (Omeprazole Magnesium) .... One By Mouth Daily 7)  Fenofibrate Micronized 134 Mg Caps (Fenofibrate Micronized) .Marland Kitchen.. 1 Once Daily 8)  Betamethasone Dipropionate 0.05 % Lotn (Betamethasone Dipropionate) .... Aplly Mixture To Dry Skin Two Times A Day  1/4 Betamethasone Loton With 3/4 Eucerin Lotion 9)  Vitamin D3 400 Unit Tabs (Cholecalciferol) .Marland Kitchen.. 1 Once Daily 10)  Vitamin E 400 Unit Caps (Vitamin E) .Marland Kitchen.. 1 Once Daily  Current Medications (verified): 1)  Fiorinal/codeine #3 50-325-40-30 Mg  Caps (Butalbital-Asa-Caff-Codeine) .... Take Two Capsules By Mouth As Needed  For Headachef 2)  Methadose 40 Mg  Tbso (Methadone Hcl) .... One Tab Four Times A Day 3)  Oxycodone Hcl 30 Mg  Tabs (Oxycodone Hcl) .Marland Kitchen.. 1 Every 4-6 Hours As Needed 4)  Fentanyl 75 Mcg/hr  Pt72 (Fentanyl) .... Change Every 72 Hours 5)  Alprazolam 2 Mg  Tb24 (Alprazolam) .Marland Kitchen.. 1 Two Times A Day 6)  Prilosec Otc 20 Mg Tbec (Omeprazole Magnesium) .... One By Mouth Daily 7)  Fenofibrate Micronized 134 Mg Caps (Fenofibrate Micronized) .Marland Kitchen.. 1 Once  Daily 8)  Betamethasone Dipropionate 0.05 % Lotn (Betamethasone Dipropionate) .... Aplly Mixture To Dry Skin Two Times A Day  1/4 Betamethasone Loton With 3/4 Eucerin Lotion 9)  Vitamin D3 400 Unit Tabs (Cholecalciferol) .Marland Kitchen.. 1 Once Daily 10)  Vitamin E 400 Unit Caps (Vitamin E) .Marland Kitchen.. 1 Once Daily  Allergies (verified): No Known Drug Allergies  Past History:  Family History: Last updated: 11/26/2007 Family History Hypertension  Social History: Last updated: 11/26/2007 Occupation: Married Former Smoker  Risk Factors: Exercise: yes (08/26/2008)  Risk Factors: Smoking Status: current (11/19/2010) Packs/Day: .5 (11/19/2010) Passive Smoke Exposure: yes (11/19/2010)  Past medical, surgical, family and social histories (including risk factors) reviewed, and no changes noted (except as noted below).  Past Medical History: Reviewed history from 11/25/2008 and no changes required. chronic back pain disc disease Hyperlipidemia cervical arthritis Headache HIGH TRIGLYCERDES  Past Surgical History: Reviewed history from 08/26/2008 and no changes required. Dr Channing Mutters epidural steroids Lumbar laminectomy Lumbar fusion  Family History: Reviewed history from 11/26/2007 and no changes required. Family History Hypertension  Social History: Reviewed history from 11/26/2007 and no changes required. Occupation: Married Former Smoker  Review of Systems  The patient denies anorexia, fever, weight loss, weight gain, vision loss, decreased hearing, hoarseness, chest pain, syncope, dyspnea on exertion, peripheral edema, prolonged cough, headaches, hemoptysis, abdominal pain, melena, hematochezia, severe indigestion/heartburn, hematuria, incontinence, genital sores, muscle weakness, suspicious skin lesions, transient blindness, difficulty walking, depression, unusual weight change, abnormal bleeding, enlarged lymph nodes, angioedema, breast masses, and testicular masses.    Physical  Exam  General:  alert and overweight-appearing.   Head:  normocephalic and atraumatic.   Eyes:  pupils equal and pupils round.   Ears:  R ear normal and L ear normal.   Nose:  no external deformity and no nasal discharge.   Mouth:  no posterior lymphoid hypertrophy and no postnasal drip.   Neck:  full ROM and nuchal rigidity.   Lungs:  normal respiratory effort and R wheezes THAT CLEAR WITH COUGH  stable Heart:  normal rate and regular rhythm.     Impression & Recommendations:  Problem # 1:  TOBACCO ABUSE (ICD-305.1)  offered counseling and referral for continued tobacco abuse  Problem # 2:  DYSMETABOLIC SYNDROME X (ICD-277.7)  weight loss and exercise are key however the treatment for the elevated cholesterol is necessary he has been intolerant of the fenofibrate in the past we discussed the use of visual or kril oil as a alternative  Problem # 3:  BACK PAIN, CHRONIC (ICD-724.5)  his chronic back pain is controlled by his current medication regime His updated medication list for this problem includes:    Fiorinal/codeine #3 50-325-40-30 Mg Caps (Butalbital-asa-caff-codeine) .Marland Kitchen... Take two capsules by mouth as needed  for headachef    Methadose 40 Mg Tbso (Methadone hcl) ..... One tab four times a day    Oxycodone Hcl  30 Mg Tabs (Oxycodone hcl) .Marland Kitchen... 1 every 4-6 hours as needed    Fentanyl 75 Mcg/hr Pt72 (Fentanyl) .Marland Kitchen... Change every 72 hours  Discussed use of moist heat or ice, modified activities, medications, and stretching/strengthening exercises. Back care instructions given. To be seen in 2 weeks if no improvement; sooner if worsening of symptoms.   Problem # 4:  EPICONDYLITIS, LEFT (ICD-726.32) Assessment: Unchanged  Informed consent obtained and then the left elbow joint was prepped in a sterile manor and 40 mg depo and 1/2 cc 1% lidocaine injected into the synovial space. After care discussed. Pt tolerated procedure well.  Orders: Injection, Tendon / Ligament  (65784) Depo- Medrol 40mg  (J1030)  Complete Medication List: 1)  Fiorinal/codeine #3 50-325-40-30 Mg Caps (Butalbital-asa-caff-codeine) .... Take two capsules by mouth as needed  for headachef 2)  Methadose 40 Mg Tbso (Methadone hcl) .... One tab four times a day 3)  Oxycodone Hcl 30 Mg Tabs (Oxycodone hcl) .Marland Kitchen.. 1 every 4-6 hours as needed 4)  Fentanyl 75 Mcg/hr Pt72 (Fentanyl) .... Change every 72 hours 5)  Alprazolam 2 Mg Tb24 (Alprazolam) .Marland Kitchen.. 1 two times a day 6)  Prilosec Otc 20 Mg Tbec (Omeprazole magnesium) .... One by mouth daily 7)  Fenofibrate Micronized 134 Mg Caps (Fenofibrate micronized) .Marland Kitchen.. 1 once daily 8)  Betamethasone Dipropionate 0.05 % Lotn (Betamethasone dipropionate) .... Aplly mixture to dry skin two times a day  1/4 betamethasone loton with 3/4 eucerin lotion 9)  Vitamin D3 400 Unit Tabs (Cholecalciferol) .Marland Kitchen.. 1 once daily 10)  Vitamin E 400 Unit Caps (Vitamin e) .Marland Kitchen.. 1 once daily  Patient Instructions: 1)  Please schedule a follow-up appointment in 3 months. 2)  1. limit all sugar... use nutrasweet or "blue" 3)  2. limit carbohydrates  ( white ones.... bread, chips and fries) 4)  3. eat four square... three meals and one snak 5)  4. avoid soft drings and dessert ( cookies tc) Prescriptions: FENTANYL 75 MCG/HR  PT72 (FENTANYL) change every 72 hours  #10 x 0   Entered by:   Willy Eddy, LPN   Authorized by:   Stacie Glaze MD   Signed by:   Willy Eddy, LPN on 69/62/9528   Method used:   Print then Give to Patient   RxID:   [id number] OXYCODONE HCL 30 MG  TABS (OXYCODONE HCL) 1 every 4-6 hours as needed  #180 x 0   Entered by:   Willy Eddy, LPN   Authorized by:   Stacie Glaze MD   Signed by:   Willy Eddy, LPN on 44/01/4741   Method used:   Print then Give to Patient   RxID:   [id number] METHADOSE 40 MG  TBSO (METHADONE HCL) one tab four times a day  #120 x 0   Entered by:   Willy Eddy, LPN   Authorized by:    Stacie Glaze MD   Signed by:   Willy Eddy, LPN on 88/41/6606   Method used:   Print then Give to Patient   RxID:   [id number] FENTANYL 75 MCG/HR  PT72 (FENTANYL) change every 72 hours  #10 x 0   Entered by:   Willy Eddy, LPN   Authorized by:   Stacie Glaze MD   Signed by:   Willy Eddy, LPN on 20/25/4270   Method used:   Print then Give to Patient   RxID:   [id number] OXYCODONE HCL 30 MG  TABS (  OXYCODONE HCL) 1 every 4-6 hours as needed  #180 x 0   Entered by:   Willy Eddy, LPN   Authorized by:   Stacie Glaze MD   Signed by:   Willy Eddy, LPN on 63/87/5643   Method used:   Print then Give to Patient   RxID:   [id number] METHADOSE 40 MG  TBSO (METHADONE HCL) one tab four times a day  #120 x 0   Entered by:   Willy Eddy, LPN   Authorized by:   Stacie Glaze MD   Signed by:   Willy Eddy, LPN on 60/08/9322   Method used:   Print then Give to Patient   RxID:   [id number] FIORINAL/CODEINE #3 76-283-15-17 MG  CAPS (BUTALBITAL-ASA-CAFF-CODEINE) take two capsules by mouth as needed  for headachef  #60 x 3   Entered by:   Willy Eddy, LPN   Authorized by:   Stacie Glaze MD   Signed by:   Willy Eddy, LPN on 61/60/7371   Method used:   Print then Give to Patient   RxID:   [id number] ALPRAZOLAM 2 MG  TB24 (ALPRAZOLAM) 1 two times a day  #60 x 3   Entered by:   Willy Eddy, LPN   Authorized by:   Stacie Glaze MD   Signed by:   Willy Eddy, LPN on 09/38/1829   Method used:   Print then Give to Patient   RxID:   [id number] FENTANYL 75 MCG/HR  PT72 (FENTANYL) change every 72 hours  #10 x 0   Entered by:   Willy Eddy, LPN   Authorized by:   Stacie Glaze MD   Signed by:   Willy Eddy, LPN on 51/12/5850   Method used:   Print then Give to Patient   RxID:   [id number] OXYCODONE HCL 30 MG  TABS (OXYCODONE HCL) 1 every 4-6 hours as needed  #180 x 0   Entered  by:   Willy Eddy, LPN   Authorized by:   Stacie Glaze MD   Signed by:   Willy Eddy, LPN on 40/06/6760   Method used:   Print then Give to Patient   RxID:   [id number] METHADOSE 40 MG  TBSO (METHADONE HCL) one tab four times a day  #120 x 0   Entered by:   Willy Eddy, LPN   Authorized by:   Stacie Glaze MD   Signed by:   Willy Eddy, LPN on 83/38/2505   Method used:   Print then Give to Patient   RxID:   [id number] FIORINAL/CODEINE #3 97-353-29-92 MG  CAPS (BUTALBITAL-ASA-CAFF-CODEINE) take two capsules by mouth as needed  for headachef  #60 x 3   Entered by:   Willy Eddy, LPN   Authorized by:   Stacie Glaze MD   Signed by:   Willy Eddy, LPN on 42/68/3419   Method used:   Print then Give to Patient   RxID:   [id number]    Orders Added: 1)  Est. Patient Level IV [40814] 2)  Injection, Tendon / Ligament [20550] 3)  Depo- Medrol 40mg  [J1030]

## 2010-12-09 NOTE — Assessment & Plan Note (Signed)
Summary: 3 MONTH ROV/NJR   Vital Signs:  Patient profile:   45 year old male Height:      68 inches Weight:      216 pounds BMI:     32.96 Temp:     98.2 degrees F oral Pulse rate:   92 / minute Resp:     14 per minute BP sitting:   130 / 80  (left arm)  Vitals Entered By: Willy Eddy, LPN (November 18, 2009 11:13 AM) CC: med review   Primary Care Provider:  Stacie Glaze MD  CC:  med review.  History of Present Illness: follow up for refills f chronic pain management and for several acute issues including: 1. acute new sebborhea on the nads and elbow with dry weather onset. 2. worseing knee pain with hx f meniscal injury and chrnoic painamubulation painfull and increased with increased weight bearing 3. GERD stable on medications 4. HA pattern stable 5. chronic back pain stable   Preventive Screening-Counseling & Management  Alcohol-Tobacco     Smoking Status: current     Smoking Cessation Counseling: yes     Smoke Cessation Stage: contemplative     Packs/Day: .5     Passive Smoke Exposure: yes  Problems Prior to Update: 1)  Loc Osteoarthros Not Spec Prim/sec Lower Leg  (ICD-715.36) 2)  Gastroesophageal Reflux Disease  (ICD-530.81) 3)  Tobacco Abuse  (ICD-305.1) 4)  Dysmetabolic Syndrome X  (ICD-277.7) 5)  Headache  (ICD-784.0) 6)  Physical Examination  (ICD-V70.0) 7)  Nephrolithiasis, Recurrent  (ICD-592.0) 8)  Disc Disease, Lumbar  (ICD-722.52) 9)  Back Pain, Chronic  (ICD-724.5) 10)  Hyperlipidemia  (ICD-272.4)  Medications Prior to Update: 1)  Fiorinal/codeine #3 50-325-40-30 Mg  Caps (Butalbital-Asa-Caff-Codeine) .... Take Two Capsules By Mouth As Needed  For Headachef 2)  Methadose 40 Mg  Tbso (Methadone Hcl) .... One Tab Four Times A Day 3)  Oxycodone Hcl 30 Mg  Tabs (Oxycodone Hcl) .Marland Kitchen.. 1 Every 4-6 Hours As Needed 4)  Fentanyl 75 Mcg/hr  Pt72 (Fentanyl) .... Change Every 72 Hours 5)  Alprazolam 2 Mg  Tb24 (Alprazolam) .Marland Kitchen.. 1 Two Times A Day 6)   Prilosec Otc 20 Mg Tbec (Omeprazole Magnesium) .... One By Mouth Daily 7)  Trilipix 135 Mg Cpdr (Choline Fenofibrate) .... One By Mouth Daily  Current Medications (verified): 1)  Fiorinal/codeine #3 50-325-40-30 Mg  Caps (Butalbital-Asa-Caff-Codeine) .... Take Two Capsules By Mouth As Needed  For Headachef 2)  Methadose 40 Mg  Tbso (Methadone Hcl) .... One Tab Four Times A Day 3)  Oxycodone Hcl 30 Mg  Tabs (Oxycodone Hcl) .Marland Kitchen.. 1 Every 4-6 Hours As Needed 4)  Fentanyl 75 Mcg/hr  Pt72 (Fentanyl) .... Change Every 72 Hours 5)  Alprazolam 2 Mg  Tb24 (Alprazolam) .Marland Kitchen.. 1 Two Times A Day 6)  Prilosec Otc 20 Mg Tbec (Omeprazole Magnesium) .... One By Mouth Daily 7)  Trilipix 135 Mg Cpdr (Choline Fenofibrate) .... One By Mouth Daily 8)  Betamethasone Dipropionate 0.05 % Lotn (Betamethasone Dipropionate) .... Aplly Mixture To Dry Skin Two Times A Day  1/4 Betamethasone Loton With 3/4 Eucerin Lotion  Allergies (verified): No Known Drug Allergies  Past History:  Family History: Last updated: 11/26/2007 Family History Hypertension  Social History: Last updated: 11/26/2007 Occupation: Married Former Smoker  Risk Factors: Exercise: yes (08/26/2008)  Risk Factors: Smoking Status: current (11/18/2009) Packs/Day: .5 (11/18/2009) Passive Smoke Exposure: yes (11/18/2009)  Past medical, surgical, family and social histories (including risk factors) reviewed, and  no changes noted (except as noted below).  Past Medical History: Reviewed history from 11/25/2008 and no changes required. chronic back pain disc disease Hyperlipidemia cervical arthritis Headache HIGH TRIGLYCERDES  Past Surgical History: Reviewed history from 08/26/2008 and no changes required. Dr Channing Mutters epidural steroids Lumbar laminectomy Lumbar fusion  Family History: Reviewed history from 11/26/2007 and no changes required. Family History Hypertension  Social History: Reviewed history from 11/26/2007 and no changes  required. Occupation: Married Former Smoker  Review of Systems  The patient denies anorexia, fever, weight loss, weight gain, vision loss, decreased hearing, hoarseness, chest pain, syncope, dyspnea on exertion, peripheral edema, prolonged cough, headaches, hemoptysis, abdominal pain, melena, hematochezia, severe indigestion/heartburn, hematuria, incontinence, genital sores, muscle weakness, suspicious skin lesions, transient blindness, difficulty walking, depression, unusual weight change, abnormal bleeding, enlarged lymph nodes, angioedema, breast masses, and testicular masses.    Physical Exam  General:  Well-developed,well-nourished,in no acute distress; alert,appropriate and cooperative throughout examination Eyes:  pupils equal and pupils round.   Ears:  R ear normal and L ear normal.   Nose:  no external deformity and no nasal discharge.   Mouth:  no posterior lymphoid hypertrophy and no postnasal drip.   Neck:  full ROM and nuchal rigidity.   Lungs:  normal respiratory effort and R wheezes THAT CLEAR WITH COUGH Heart:  regular rhythm and tachycardia.   Abdomen:  soft, non-tender, and normal bowel sounds.   Msk:  lumbar lordosis, SI joint tenderness, and trigger point tenderness.   Pulses:  R and L carotid,radial,femoral,dorsalis pedis and posterior tibial pulses are full and equal bilaterally Extremities:  No clubbing, cyanosis, edema, or deformity noted with normal full range of motion of all joints.   Neurologic:  alert & oriented X3 and gait normal.     Impression & Recommendations:  Problem # 1:  OTHER SEBORRHEIC DERMATITIS (ICD-690.18) betamethasone lotion in eucerin for dry skin two times a dayrx given  Problem # 2:  TOBACCO ABUSE (ICD-305.1)  urged to stop and gave ideas to stop  Encouraged smoking cessation and discussed different methods for smoking cessation.   Problem # 3:  BACK PAIN, CHRONIC (ICD-724.5) stable pain management with refill for 90 days given  today His updated medication list for this problem includes:    Fiorinal/codeine #3 50-325-40-30 Mg Caps (Butalbital-asa-caff-codeine) .Marland Kitchen... Take two capsules by mouth as needed  for headachef    Methadose 40 Mg Tbso (Methadone hcl) ..... One tab four times a day    Oxycodone Hcl 30 Mg Tabs (Oxycodone hcl) .Marland Kitchen... 1 every 4-6 hours as needed    Fentanyl 75 Mcg/hr Pt72 (Fentanyl) .Marland Kitchen... Change every 72 hours  Discussed use of moist heat or ice, modified activities, medications, and stretching/strengthening exercises. Back care instructions given. To be seen in 2 weeks if no improvement; sooner if worsening of symptoms.   Problem # 4:  LOC OSTEOARTHROS NOT SPEC PRIM/SEC LOWER LEG (ICD-715.36) Assessment: Deteriorated  Informed consent obtained and then the joint was prepped in a sterile manor and 40 mg depo and 1/2 cc 1% lidocaine injected into the synovial space of the right knee. After care discussed. Pt tolerated procedure well.  increased arthritic pain with las injection over 6 months ago with some relief His updated medication list for this problem includes:    Fiorinal/codeine #3 50-325-40-30 Mg Caps (Butalbital-asa-caff-codeine) .Marland Kitchen... Take two capsules by mouth as needed  for headachef    Methadose 40 Mg Tbso (Methadone hcl) ..... One tab four times a day  Oxycodone Hcl 30 Mg Tabs (Oxycodone hcl) .Marland Kitchen... 1 every 4-6 hours as needed    Fentanyl 75 Mcg/hr Pt72 (Fentanyl) .Marland Kitchen... Change every 72 hours  Orders: Joint Aspirate / Injection, Large (20610) Depo- Medrol 40mg  (J1030)  Discussed use of medications, application of heat or cold, and exercises.   Complete Medication List: 1)  Fiorinal/codeine #3 50-325-40-30 Mg Caps (Butalbital-asa-caff-codeine) .... Take two capsules by mouth as needed  for headachef 2)  Methadose 40 Mg Tbso (Methadone hcl) .... One tab four times a day 3)  Oxycodone Hcl 30 Mg Tabs (Oxycodone hcl) .Marland Kitchen.. 1 every 4-6 hours as needed 4)  Fentanyl 75 Mcg/hr Pt72  (Fentanyl) .... Change every 72 hours 5)  Alprazolam 2 Mg Tb24 (Alprazolam) .Marland Kitchen.. 1 two times a day 6)  Prilosec Otc 20 Mg Tbec (Omeprazole magnesium) .... One by mouth daily 7)  Trilipix 135 Mg Cpdr (Choline fenofibrate) .... One by mouth daily 8)  Betamethasone Dipropionate 0.05 % Lotn (Betamethasone dipropionate) .... Aplly mixture to dry skin two times a day  1/4 betamethasone loton with 3/4 eucerin lotion  Patient Instructions: 1)  Please schedule a follow-up appointment in 3 months. Prescriptions: BETAMETHASONE DIPROPIONATE 0.05 % LOTN (BETAMETHASONE DIPROPIONATE) aplly mixture to dry skin two times a day  1/4 betamethasone loton with 3/4 eucerin lotion  #60 x 1   Entered and Authorized by:   Stacie Glaze MD   Signed by:   Stacie Glaze MD on 11/18/2009   Method used:   Print then Give to Patient   RxID:   [id number] FENTANYL 75 MCG/HR  PT72 (FENTANYL) change every 72 hours  #10 x 0   Entered by:   Willy Eddy, LPN   Authorized by:   Stacie Glaze MD   Signed by:   Willy Eddy, LPN on 14/78/2956   Method used:   Print then Give to Patient   RxID:   [id number] OXYCODONE HCL 30 MG  TABS (OXYCODONE HCL) 1 every 4-6 hours as needed  #180 x 0   Entered by:   Willy Eddy, LPN   Authorized by:   Stacie Glaze MD   Signed by:   Willy Eddy, LPN on 28/41/3244   Method used:   Print then Give to Patient   RxID:   [id number] METHADOSE 40 MG  TBSO (METHADONE HCL) one tab four times a day  #120 x 0   Entered by:   Willy Eddy, LPN   Authorized by:   Stacie Glaze MD   Signed by:   Willy Eddy, LPN on 42/59/5638   Method used:   Print then Give to Patient   RxID:   [id number] FENTANYL 75 MCG/HR  PT72 (FENTANYL) change every 72 hours  #10 x 0   Entered by:   Willy Eddy, LPN   Authorized by:   Stacie Glaze MD   Signed by:   Willy Eddy, LPN on 05/06/1600   Method used:   Print then Give to Patient   RxID:    [id number] OXYCODONE HCL 30 MG  TABS (OXYCODONE HCL) 1 every 4-6 hours as needed  #180 x 0   Entered by:   Willy Eddy, LPN   Authorized by:   Stacie Glaze MD   Signed by:   Willy Eddy, LPN on 70/62/3762   Method used:   Print then Give to Patient   RxID:   [id number] METHADOSE 40 MG  TBSO (METHADONE HCL) one tab four times a day  #120 x 0   Entered by:   Willy Eddy, LPN   Authorized by:   Stacie Glaze MD   Signed by:   Willy Eddy, LPN on 62/13/0865   Method used:   Print then Give to Patient   RxID:   [id number] FIORINAL/CODEINE #3 40-102-72-53 MG  CAPS (BUTALBITAL-ASA-CAFF-CODEINE) take two capsules by mouth as needed  for headachef  #60 x 3   Entered by:   Willy Eddy, LPN   Authorized by:   Stacie Glaze MD   Signed by:   Willy Eddy, LPN on 66/44/0347   Method used:   Print then Give to Patient   RxID:   [id number] ALPRAZOLAM 2 MG  TB24 (ALPRAZOLAM) 1 two times a day  #60 x 3   Entered by:   Willy Eddy, LPN   Authorized by:   Stacie Glaze MD   Signed by:   Willy Eddy, LPN on 51/88/4166   Method used:   Print then Give to Patient   RxID:   [id number] FENTANYL 75 MCG/HR  PT72 (FENTANYL) change every 72 hours  #10 x 0   Entered by:   Willy Eddy, LPN   Authorized by:   Stacie Glaze MD   Signed by:   Willy Eddy, LPN on 32/20/2542   Method used:   Print then Give to Patient   RxID:   [id number] OXYCODONE HCL 30 MG  TABS (OXYCODONE HCL) 1 every 4-6 hours as needed  #180 x 0   Entered by:   Willy Eddy, LPN   Authorized by:   Stacie Glaze MD   Signed by:   Willy Eddy, LPN on 60/73/7106   Method used:   Print then Give to Patient   RxID:   [id number] METHADOSE 40 MG  TBSO (METHADONE HCL) one tab four times a day  #120 x 0   Entered by:   Willy Eddy, LPN   Authorized by:   Stacie Glaze MD   Signed by:   Willy Eddy, LPN on 38/18/2993    Method used:   Print then Give to Patient   RxID:   [id number]

## 2010-12-09 NOTE — Assessment & Plan Note (Signed)
Summary: 3 month rov/njr   Vital Signs:  Patient profile:   45 year old male Height:      68 inches Weight:      214 pounds BMI:     32.66 Temp:     98.2 degrees F oral Pulse rate:   80 / minute Resp:     14 per minute BP sitting:   110 / 80  (left arm)  Vitals Entered By: Willy Eddy, LPN (May 18, 2010 10:29 AM) CC: roa-  med review   Primary Care Provider:  Stacie Glaze MD  CC:  roa-  med review.  History of Present Illness: The pt presents for routine follow up for pain management and has a chrilf complaint of pain in the knee with hx of DJD of the knee The knee pain in in the infrapatellar bursa on the right pain rated as 8/10 hx of response to shot in past  ha HX ID STABLE use of methadone is appropriate back pain is the underlying etiology and he continues to e gainfully emplyed.  Preventive Screening-Counseling & Management  Alcohol-Tobacco     Smoking Status: current     Smoking Cessation Counseling: yes     Smoke Cessation Stage: contemplative     Packs/Day: .5     Passive Smoke Exposure: yes  Problems Prior to Update: 1)  Loc Osteoarthros Not Spec Prim/sec Lower Leg  (ICD-715.36) 2)  Other Seborrheic Dermatitis  (ICD-690.18) 3)  Gastroesophageal Reflux Disease  (ICD-530.81) 4)  Tobacco Abuse  (ICD-305.1) 5)  Dysmetabolic Syndrome X  (ICD-277.7) 6)  Headache  (ICD-784.0) 7)  Physical Examination  (ICD-V70.0) 8)  Nephrolithiasis, Recurrent  (ICD-592.0) 9)  Disc Disease, Lumbar  (ICD-722.52) 10)  Back Pain, Chronic  (ICD-724.5) 11)  Hyperlipidemia  (ICD-272.4)  Current Problems (verified): 1)  Loc Osteoarthros Not Spec Prim/sec Lower Leg  (ICD-715.36) 2)  Other Seborrheic Dermatitis  (ICD-690.18) 3)  Gastroesophageal Reflux Disease  (ICD-530.81) 4)  Tobacco Abuse  (ICD-305.1) 5)  Dysmetabolic Syndrome X  (ICD-277.7) 6)  Headache  (ICD-784.0) 7)  Physical Examination  (ICD-V70.0) 8)  Nephrolithiasis, Recurrent  (ICD-592.0) 9)  Disc  Disease, Lumbar  (ICD-722.52) 10)  Back Pain, Chronic  (ICD-724.5) 11)  Hyperlipidemia  (ICD-272.4)  Medications Prior to Update: 1)  Fiorinal/codeine #3 50-325-40-30 Mg  Caps (Butalbital-Asa-Caff-Codeine) .... Take Two Capsules By Mouth As Needed  For Headachef 2)  Methadose 40 Mg  Tbso (Methadone Hcl) .... One Tab Four Times A Day 3)  Oxycodone Hcl 30 Mg  Tabs (Oxycodone Hcl) .Marland Kitchen.. 1 Every 4-6 Hours As Needed 4)  Fentanyl 75 Mcg/hr  Pt72 (Fentanyl) .... Change Every 72 Hours 5)  Alprazolam 2 Mg  Tb24 (Alprazolam) .Marland Kitchen.. 1 Two Times A Day 6)  Prilosec Otc 20 Mg Tbec (Omeprazole Magnesium) .... One By Mouth Daily 7)  Trilipix 135 Mg Cpdr (Choline Fenofibrate) .... One By Mouth Daily 8)  Betamethasone Dipropionate 0.05 % Lotn (Betamethasone Dipropionate) .... Aplly Mixture To Dry Skin Two Times A Day  1/4 Betamethasone Loton With 3/4 Eucerin Lotion 9)  Vitamin D3 400 Unit Tabs (Cholecalciferol) .Marland Kitchen.. 1 Once Daily 10)  Vitamin E 400 Unit Caps (Vitamin E) .Marland Kitchen.. 1 Once Daily  Current Medications (verified): 1)  Fiorinal/codeine #3 50-325-40-30 Mg  Caps (Butalbital-Asa-Caff-Codeine) .... Take Two Capsules By Mouth As Needed  For Headachef 2)  Methadose 40 Mg  Tbso (Methadone Hcl) .... One Tab Four Times A Day 3)  Oxycodone Hcl 30 Mg  Tabs (Oxycodone  Hcl) .... 1 Every 4-6 Hours As Needed 4)  Fentanyl 75 Mcg/hr  Pt72 (Fentanyl) .... Change Every 72 Hours 5)  Alprazolam 2 Mg  Tb24 (Alprazolam) .Marland Kitchen.. 1 Two Times A Day 6)  Prilosec Otc 20 Mg Tbec (Omeprazole Magnesium) .... One By Mouth Daily 7)  Trilipix 135 Mg Cpdr (Choline Fenofibrate) .... One By Mouth Daily 8)  Betamethasone Dipropionate 0.05 % Lotn (Betamethasone Dipropionate) .... Aplly Mixture To Dry Skin Two Times A Day  1/4 Betamethasone Loton With 3/4 Eucerin Lotion 9)  Vitamin D3 400 Unit Tabs (Cholecalciferol) .Marland Kitchen.. 1 Once Daily 10)  Vitamin E 400 Unit Caps (Vitamin E) .Marland Kitchen.. 1 Once Daily  Allergies (verified): No Known Drug Allergies  Past  History:  Family History: Last updated: 11/26/2007 Family History Hypertension  Social History: Last updated: 11/26/2007 Occupation: Married Former Smoker  Risk Factors: Exercise: yes (08/26/2008)  Risk Factors: Smoking Status: current (05/18/2010) Packs/Day: .5 (05/18/2010) Passive Smoke Exposure: yes (05/18/2010)  Past medical, surgical, family and social histories (including risk factors) reviewed, and no changes noted (except as noted below).  Past Medical History: Reviewed history from 11/25/2008 and no changes required. chronic back pain disc disease Hyperlipidemia cervical arthritis Headache HIGH TRIGLYCERDES  Past Surgical History: Reviewed history from 08/26/2008 and no changes required. Dr Channing Mutters epidural steroids Lumbar laminectomy Lumbar fusion  Family History: Reviewed history from 11/26/2007 and no changes required. Family History Hypertension  Social History: Reviewed history from 11/26/2007 and no changes required. Occupation: Married Former Smoker  Review of Systems       The patient complains of weight gain, dyspnea on exertion, and peripheral edema.  The patient denies anorexia, fever, weight loss, vision loss, decreased hearing, hoarseness, chest pain, syncope, prolonged cough, headaches, hemoptysis, abdominal pain, melena, hematochezia, severe indigestion/heartburn, hematuria, incontinence, genital sores, muscle weakness, suspicious skin lesions, transient blindness, difficulty walking, depression, unusual weight change, abnormal bleeding, enlarged lymph nodes, angioedema, breast masses, and testicular masses.         severe knee pain  Physical Exam  General:  alert and overweight-appearing.   Eyes:  pupils equal and pupils round.   Ears:  R ear normal and L ear normal.   Nose:  no external deformity and no nasal discharge.   Mouth:  no posterior lymphoid hypertrophy and no postnasal drip.   Neck:  full ROM and nuchal rigidity.   Lungs:   normal respiratory effort and R wheezes THAT CLEAR WITH COUGH  stable Heart:  normal rate and regular rhythm.   Abdomen:  soft, non-tender, and normal bowel sounds.   Msk:  decreased ROM, joint tenderness, and joint swelling.   right knee Pulses:  R and L carotid,radial,femoral,dorsalis pedis and posterior tibial pulses are full and equal bilaterally Extremities:  No clubbing, cyanosis, edema, or deformity noted with normal full range of motion of all joints.   Neurologic:  No cranial nerve deficits noted. Station and gait are normal. Plantar reflexes are down-going bilaterally. DTRs are symmetrical throughout. Sensory, motor and coordinative functions appear intact.   Impression & Recommendations:  Problem # 1:  LOC OSTEOARTHROS NOT SPEC PRIM/SEC LOWER LEG (ICD-715.36)  Informed consent obtained and then the  right knee joint was prepped in a sterile manor and 40 mg depo and 1/2 cc 1% lidocaine injected into the synovial space. After care discussed. Pt tolerated procedure well.  His updated medication list for this problem includes:    Fiorinal/codeine #3 50-325-40-30 Mg Caps (Butalbital-asa-caff-codeine) .Marland Kitchen... Take two capsules by mouth as needed  for headachef    Methadose 40 Mg Tbso (Methadone hcl) ..... One tab four times a day    Oxycodone Hcl 30 Mg Tabs (Oxycodone hcl) .Marland Kitchen... 1 every 4-6 hours as needed    Fentanyl 75 Mcg/hr Pt72 (Fentanyl) .Marland Kitchen... Change every 72 hours  Discussed use of medications, application of heat or cold, and exercises.   Orders: Joint Aspirate / Injection, Large (20610) Depo- Medrol 40mg  (J1030)  Problem # 2:  TOBACCO ABUSE (ICD-305.1) Assessment: Unchanged  Encouraged smoking cessation and discussed different methods for smoking cessation.   Orders: Tobacco use cessation intermediate 3-10 minutes (99406)  Problem # 3:  DYSMETABOLIC SYNDROME X (ICD-277.7)  DM  Problem # 4:  HEADACHE (ICD-784.0)  His updated medication list for this problem  includes:    Fiorinal/codeine #3 50-325-40-30 Mg Caps (Butalbital-asa-caff-codeine) .Marland Kitchen... Take two capsules by mouth as needed  for headachef    Methadose 40 Mg Tbso (Methadone hcl) ..... One tab four times a day    Oxycodone Hcl 30 Mg Tabs (Oxycodone hcl) .Marland Kitchen... 1 every 4-6 hours as needed    Fentanyl 75 Mcg/hr Pt72 (Fentanyl) .Marland Kitchen... Change every 72 hours  Complete Medication List: 1)  Fiorinal/codeine #3 50-325-40-30 Mg Caps (Butalbital-asa-caff-codeine) .... Take two capsules by mouth as needed  for headachef 2)  Methadose 40 Mg Tbso (Methadone hcl) .... One tab four times a day 3)  Oxycodone Hcl 30 Mg Tabs (Oxycodone hcl) .Marland Kitchen.. 1 every 4-6 hours as needed 4)  Fentanyl 75 Mcg/hr Pt72 (Fentanyl) .... Change every 72 hours 5)  Alprazolam 2 Mg Tb24 (Alprazolam) .Marland Kitchen.. 1 two times a day 6)  Prilosec Otc 20 Mg Tbec (Omeprazole magnesium) .... One by mouth daily 7)  Trilipix 135 Mg Cpdr (Choline fenofibrate) .... One by mouth daily 8)  Betamethasone Dipropionate 0.05 % Lotn (Betamethasone dipropionate) .... Aplly mixture to dry skin two times a day  1/4 betamethasone loton with 3/4 eucerin lotion 9)  Vitamin D3 400 Unit Tabs (Cholecalciferol) .Marland Kitchen.. 1 once daily 10)  Vitamin E 400 Unit Caps (Vitamin e) .Marland Kitchen.. 1 once daily  Patient Instructions: 1)  Please schedule a follow-up appointment in 3 months. Prescriptions: FENTANYL 75 MCG/HR  PT72 (FENTANYL) change every 72 hours  #10 x 0   Entered by:   Willy Eddy, LPN   Authorized by:   Stacie Glaze MD   Signed by:   Willy Eddy, LPN on 16/08/9603   Method used:   Print then Give to Patient   RxID:   [id number] OXYCODONE HCL 30 MG  TABS (OXYCODONE HCL) 1 every 4-6 hours as needed  #180 x 0   Entered by:   Willy Eddy, LPN   Authorized by:   Stacie Glaze MD   Signed by:   Willy Eddy, LPN on 21/30/8657   Method used:   Print then Give to Patient   RxID:   [id number] METHADOSE 40 MG  TBSO (METHADONE HCL) one tab  four times a day  #120 x 0   Entered by:   Willy Eddy, LPN   Authorized by:   Stacie Glaze MD   Signed by:   Willy Eddy, LPN on 11/09/7251   Method used:   Print then Give to Patient   RxID:   [id number] FENTANYL 75 MCG/HR  PT72 (FENTANYL) change every 72 hours  #10 x 0   Entered by:   Willy Eddy, LPN   Authorized by:   Stacie Glaze  MD   Signed by:   Willy Eddy, LPN on 69/62/9528   Method used:   Print then Give to Patient   RxID:   [id number] OXYCODONE HCL 30 MG  TABS (OXYCODONE HCL) 1 every 4-6 hours as needed  #180 x 0   Entered by:   Willy Eddy, LPN   Authorized by:   Stacie Glaze MD   Signed by:   Willy Eddy, LPN on 44/01/4741   Method used:   Print then Give to Patient   RxID:   [id number] METHADOSE 40 MG  TBSO (METHADONE HCL) one tab four times a day  #120 x 0   Entered by:   Willy Eddy, LPN   Authorized by:   Stacie Glaze MD   Signed by:   Willy Eddy, LPN on 88/41/6606   Method used:   Print then Give to Patient   RxID:   [id number] ALPRAZOLAM 2 MG  TB24 (ALPRAZOLAM) 1 two times a day  #60 x 3   Entered by:   Willy Eddy, LPN   Authorized by:   Stacie Glaze MD   Signed by:   Willy Eddy, LPN on 20/25/4270   Method used:   Print then Give to Patient   RxID:   [id number] FENTANYL 75 MCG/HR  PT72 (FENTANYL) change every 72 hours  #10 x 0   Entered by:   Willy Eddy, LPN   Authorized by:   Stacie Glaze MD   Signed by:   Willy Eddy, LPN on 73/71/0626   Method used:   Print then Give to Patient   RxID:   [id number] OXYCODONE HCL 30 MG  TABS (OXYCODONE HCL) 1 every 4-6 hours as needed  #180 x 0   Entered by:   Willy Eddy, LPN   Authorized by:   Stacie Glaze MD   Signed by:   Willy Eddy, LPN on 18/29/9371   Method used:   Print then Give to Patient   RxID:   [id number] METHADOSE 40 MG  TBSO (METHADONE HCL) one tab four times a  day  #120 x 0   Entered by:   Willy Eddy, LPN   Authorized by:   Stacie Glaze MD   Signed by:   Willy Eddy, LPN on 58/52/7782   Method used:   Print then Give to Patient   RxID:   [id number] FIORINAL/CODEINE #3 67-619-50-93 MG  CAPS (BUTALBITAL-ASA-CAFF-CODEINE) take two capsules by mouth as needed  for headachef  #60 x 3   Entered by:   Willy Eddy, LPN   Authorized by:   Stacie Glaze MD   Signed by:   Willy Eddy, LPN on 26/71/2458   Method used:   Print then Give to Patient   RxID:   [id number]

## 2010-12-09 NOTE — Miscellaneous (Signed)
Summary: Controlled Substances Contract  Controlled Substances Contract   Imported By: Maryln Gottron 05/21/2010 12:24:14  _____________________________________________________________________  External Attachment:    Type:   Image     Comment:   External Document

## 2010-12-09 NOTE — Assessment & Plan Note (Signed)
Summary: 3 MONTH ROV/NJR   Vital Signs:  Patient profile:   45 year old male Height:      68 inches Weight:      214 pounds BMI:     32.66 Temp:     98.2 degrees F oral Pulse rate:   80 / minute Resp:     14 per minute BP sitting:   116 / 80  (left arm)  Vitals Entered By: Willy Eddy, LPN (February 16, 2010 11:54 AM) CC: [facility]- med review   Primary Care Provider:  Stacie Glaze MD  CC:  [facility]- med review.  History of Present Illness: Both legs have benn swelling more ( not tight but the socks leave marks that are deeper than normal) denies more salt, alcohol  but has increased allergies with the spring. refill of medicastions today fro 90 days of controlled substances and careful monitering of use. drug contract renewed I have spent greater that 30 min face to face evaluating this patient of which 1/2 was councilling.   Preventive Screening-Counseling & Management  Alcohol-Tobacco     Smoking Status: current     Smoking Cessation Counseling: yes     Smoke Cessation Stage: contemplative     Packs/Day: .5     Passive Smoke Exposure: yes  Problems Prior to Update: 1)  Loc Osteoarthros Not Spec Prim/sec Lower Leg  (ICD-715.36) 2)  Other Seborrheic Dermatitis  (ICD-690.18) 3)  Gastroesophageal Reflux Disease  (ICD-530.81) 4)  Tobacco Abuse  (ICD-305.1) 5)  Dysmetabolic Syndrome X  (ICD-277.7) 6)  Headache  (ICD-784.0) 7)  Physical Examination  (ICD-V70.0) 8)  Nephrolithiasis, Recurrent  (ICD-592.0) 9)  Disc Disease, Lumbar  (ICD-722.52) 10)  Back Pain, Chronic  (ICD-724.5) 11)  Hyperlipidemia  (ICD-272.4)  Current Problems (verified): 1)  Loc Osteoarthros Not Spec Prim/sec Lower Leg  (ICD-715.36) 2)  Other Seborrheic Dermatitis  (ICD-690.18) 3)  Gastroesophageal Reflux Disease  (ICD-530.81) 4)  Tobacco Abuse  (ICD-305.1) 5)  Dysmetabolic Syndrome X  (ICD-277.7) 6)  Headache  (ICD-784.0) 7)  Physical Examination  (ICD-V70.0) 8)  Nephrolithiasis, Recurrent   (ICD-592.0) 9)  Disc Disease, Lumbar  (ICD-722.52) 10)  Back Pain, Chronic  (ICD-724.5) 11)  Hyperlipidemia  (ICD-272.4)  Medications Prior to Update: 1)  Fiorinal/codeine #3 50-325-40-30 Mg  Caps (Butalbital-Asa-Caff-Codeine) .... Take Two Capsules By Mouth As Needed  For Headachef 2)  Methadose 40 Mg  Tbso (Methadone Hcl) .... One Tab Four Times A Day 3)  Oxycodone Hcl 30 Mg  Tabs (Oxycodone Hcl) .Marland Kitchen.. 1 Every 4-6 Hours As Needed 4)  Fentanyl 75 Mcg/hr  Pt72 (Fentanyl) .... Change Every 72 Hours 5)  Alprazolam 2 Mg  Tb24 (Alprazolam) .Marland Kitchen.. 1 Two Times A Day 6)  Prilosec Otc 20 Mg Tbec (Omeprazole Magnesium) .... One By Mouth Daily 7)  Trilipix 135 Mg Cpdr (Choline Fenofibrate) .... One By Mouth Daily 8)  Betamethasone Dipropionate 0.05 % Lotn (Betamethasone Dipropionate) .... Aplly Mixture To Dry Skin Two Times A Day  1/4 Betamethasone Loton With 3/4 Eucerin Lotion  Current Medications (verified): 1)  Fiorinal/codeine #3 50-325-40-30 Mg  Caps (Butalbital-Asa-Caff-Codeine) .... Take Two Capsules By Mouth As Needed  For Headachef 2)  Methadose 40 Mg  Tbso (Methadone Hcl) .... One Tab Four Times A Day 3)  Oxycodone Hcl 30 Mg  Tabs (Oxycodone Hcl) .Marland Kitchen.. 1 Every 4-6 Hours As Needed 4)  Fentanyl 75 Mcg/hr  Pt72 (Fentanyl) .... Change Every 72 Hours 5)  Alprazolam 2 Mg  Tb24 (Alprazolam) .Marland KitchenMarland KitchenMarland Kitchen 1  Two Times A Day 6)  Prilosec Otc 20 Mg Tbec (Omeprazole Magnesium) .... One By Mouth Daily 7)  Trilipix 135 Mg Cpdr (Choline Fenofibrate) .... One By Mouth Daily 8)  Betamethasone Dipropionate 0.05 % Lotn (Betamethasone Dipropionate) .... Aplly Mixture To Dry Skin Two Times A Day  1/4 Betamethasone Loton With 3/4 Eucerin Lotion 9)  Vitamin D3 400 Unit Tabs (Cholecalciferol) .Marland Kitchen.. 1 Once Daily 10)  Vitamin E 400 Unit Caps (Vitamin E) .Marland Kitchen.. 1 Once Daily  Allergies (verified): No Known Drug Allergies  Past History:  Family History: Last updated: 11/26/2007 Family History Hypertension  Social  History: Last updated: 11/26/2007 Occupation: Married Former Smoker  Risk Factors: Exercise: yes (08/26/2008)  Risk Factors: Smoking Status: current (02/16/2010) Packs/Day: .5 (02/16/2010) Passive Smoke Exposure: yes (02/16/2010)  Past medical, surgical, family and social histories (including risk factors) reviewed, and no changes noted (except as noted below).  Past Medical History: Reviewed history from 11/25/2008 and no changes required. chronic back pain disc disease Hyperlipidemia cervical arthritis Headache HIGH TRIGLYCERDES  Past Surgical History: Reviewed history from 08/26/2008 and no changes required. Dr Channing Mutters epidural steroids Lumbar laminectomy Lumbar fusion  Family History: Reviewed history from 11/26/2007 and no changes required. Family History Hypertension  Social History: Reviewed history from 11/26/2007 and no changes required. Occupation: Married Former Smoker  Review of Systems  The patient denies anorexia, fever, weight loss, weight gain, vision loss, decreased hearing, hoarseness, chest pain, syncope, dyspnea on exertion, peripheral edema, prolonged cough, headaches, hemoptysis, abdominal pain, melena, hematochezia, severe indigestion/heartburn, hematuria, incontinence, genital sores, muscle weakness, suspicious skin lesions, transient blindness, difficulty walking, depression, unusual weight change, abnormal bleeding, enlarged lymph nodes, angioedema, and breast masses.    Physical Exam  General:  Well-developed,well-nourished,in no acute distress; alert,appropriate and cooperative throughout examination Eyes:  pupils equal and pupils round.   Ears:  R ear normal and L ear normal.   Nose:  no external deformity and no nasal discharge.   Mouth:  no posterior lymphoid hypertrophy and no postnasal drip.   Neck:  full ROM and nuchal rigidity.   Lungs:  normal respiratory effort and R wheezes THAT CLEAR WITH COUGH  stable Heart:  normal rate and  regular rhythm.   Abdomen:  soft, non-tender, and normal bowel sounds.   Msk:  lumbar lordosis, SI joint tenderness, and trigger point tenderness.   Pulses:  R and L carotid,radial,femoral,dorsalis pedis and posterior tibial pulses are full and equal bilaterally Neurologic:  alert & oriented X3 and gait normal.     Impression & Recommendations:  Problem # 1:  TOBACCO ABUSE (ICD-305.1)  Encouraged smoking cessation and discussed different methods for smoking cessation.   Problem # 2:  HEADACHE (ICD-784.0)  His updated medication list for this problem includes:    Fiorinal/codeine #3 50-325-40-30 Mg Caps (Butalbital-asa-caff-codeine) .Marland Kitchen... Take two capsules by mouth as needed  for headachef    Methadose 40 Mg Tbso (Methadone hcl) ..... One tab four times a day    Oxycodone Hcl 30 Mg Tabs (Oxycodone hcl) .Marland Kitchen... 1 every 4-6 hours as needed    Fentanyl 75 Mcg/hr Pt72 (Fentanyl) .Marland Kitchen... Change every 72 hours  Headache diary reviewed.  Problem # 3:  HYPERLIPIDEMIA (ICD-272.4)  His updated medication list for this problem includes:    Trilipix 135 Mg Cpdr (Choline fenofibrate) ..... One by mouth daily  Labs Reviewed: SGOT: 23 (05/20/2009)   SGPT: 17 (05/20/2009)  Lipid Goals: Chol Goal: 200 (02/24/2009)   HDL Goal: 40 (02/24/2009)   LDL  Goal: 130 (02/24/2009)   TG Goal: 150 (02/24/2009)  Prior 10 Yr Risk Heart Disease: Not enough information (02/24/2009)   HDL:26.60 (08/24/2009), 25.90 (05/20/2009)  LDL:DEL (11/18/2008), DEL (08/19/2008)  Chol:200 (08/24/2009), 191 (05/20/2009)  Trig:394.0 (05/20/2009), 1433.0 (02/16/2009)  Problem # 4:  DYSMETABOLIC SYNDROME X (ICD-277.7) weight reduction needed  Problem # 5:  BACK PAIN, CHRONIC (ICD-724.5) refill meds and renew contract for pain His updated medication list for this problem includes:    Fiorinal/codeine #3 50-325-40-30 Mg Caps (Butalbital-asa-caff-codeine) .Marland Kitchen... Take two capsules by mouth as needed  for headachef    Methadose 40 Mg  Tbso (Methadone hcl) ..... One tab four times a day    Oxycodone Hcl 30 Mg Tabs (Oxycodone hcl) .Marland Kitchen... 1 every 4-6 hours as needed    Fentanyl 75 Mcg/hr Pt72 (Fentanyl) .Marland Kitchen... Change every 72 hours  Discussed use of moist heat or ice, modified activities, medications, and stretching/strengthening exercises. Back care instructions given. To be seen in 2 weeks if no improvement; sooner if worsening of symptoms.   Complete Medication List: 1)  Fiorinal/codeine #3 50-325-40-30 Mg Caps (Butalbital-asa-caff-codeine) .... Take two capsules by mouth as needed  for headachef 2)  Methadose 40 Mg Tbso (Methadone hcl) .... One tab four times a day 3)  Oxycodone Hcl 30 Mg Tabs (Oxycodone hcl) .Marland Kitchen.. 1 every 4-6 hours as needed 4)  Fentanyl 75 Mcg/hr Pt72 (Fentanyl) .... Change every 72 hours 5)  Alprazolam 2 Mg Tb24 (Alprazolam) .Marland Kitchen.. 1 two times a day 6)  Prilosec Otc 20 Mg Tbec (Omeprazole magnesium) .... One by mouth daily 7)  Trilipix 135 Mg Cpdr (Choline fenofibrate) .... One by mouth daily 8)  Betamethasone Dipropionate 0.05 % Lotn (Betamethasone dipropionate) .... Aplly mixture to dry skin two times a day  1/4 betamethasone loton with 3/4 eucerin lotion 9)  Vitamin D3 400 Unit Tabs (Cholecalciferol) .Marland Kitchen.. 1 once daily 10)  Vitamin E 400 Unit Caps (Vitamin e) .Marland Kitchen.. 1 once daily  Patient Instructions: 1)  Please schedule a follow-up appointment in 3 months. Prescriptions: FENTANYL 75 MCG/HR  PT72 (FENTANYL) change every 72 hours  #10 x 0   Entered by:   Willy Eddy, LPN   Authorized by:   Stacie Glaze MD   Signed by:   Willy Eddy, LPN on 05/06/1600   Method used:   Print then Give to Patient   RxID:   [id number] OXYCODONE HCL 30 MG  TABS (OXYCODONE HCL) 1 every 4-6 hours as needed  #180 x 0   Entered by:   Willy Eddy, LPN   Authorized by:   Stacie Glaze MD   Signed by:   Willy Eddy, LPN on 70/62/3762   Method used:   Print then Give to Patient   RxID:    [id number] METHADOSE 40 MG  TBSO (METHADONE HCL) one tab four times a day  #120 x 0   Entered by:   Willy Eddy, LPN   Authorized by:   Stacie Glaze MD   Signed by:   Willy Eddy, LPN on 26/94/8546   Method used:   Print then Give to Patient   RxID:   [id number] FENTANYL 75 MCG/HR  PT72 (FENTANYL) change every 72 hours  #10 x 0   Entered by:   Willy Eddy, LPN   Authorized by:   Stacie Glaze MD   Signed by:   Willy Eddy, LPN on 71/69/6789   Method used:   Print then  Give to Patient   RxID:   [id number] OXYCODONE HCL 30 MG  TABS (OXYCODONE HCL) 1 every 4-6 hours as needed  #180 x 0   Entered by:   Willy Eddy, LPN   Authorized by:   Stacie Glaze MD   Signed by:   Willy Eddy, LPN on 17/61/6073   Method used:   Print then Give to Patient   RxID:   [id number] METHADOSE 40 MG  TBSO (METHADONE HCL) one tab four times a day  #120 x 0   Entered by:   Willy Eddy, LPN   Authorized by:   Stacie Glaze MD   Signed by:   Willy Eddy, LPN on 50/07/3817   Method used:   Print then Give to Patient   RxID:   [id number] ALPRAZOLAM 2 MG  TB24 (ALPRAZOLAM) 1 two times a day  #60 x 3   Entered by:   Willy Eddy, LPN   Authorized by:   Stacie Glaze MD   Signed by:   Willy Eddy, LPN on 17/51/0258   Method used:   Print then Give to Patient   RxID:   [id number] FENTANYL 75 MCG/HR  PT72 (FENTANYL) change every 72 hours  #10 x 0   Entered by:   Willy Eddy, LPN   Authorized by:   Stacie Glaze MD   Signed by:   Willy Eddy, LPN on 15/40/0867   Method used:   Print then Give to Patient   RxID:   [id number] OXYCODONE HCL 30 MG  TABS (OXYCODONE HCL) 1 every 4-6 hours as needed  #180 x 0   Entered by:   Willy Eddy, LPN   Authorized by:   Stacie Glaze MD   Signed by:   Willy Eddy, LPN on 99/83/3825   Method used:   Print then Give to Patient   RxID:    [id number] METHADOSE 40 MG  TBSO (METHADONE HCL) one tab four times a day  #120 x 0   Entered by:   Willy Eddy, LPN   Authorized by:   Stacie Glaze MD   Signed by:   Willy Eddy, LPN on 40/97/3532   Method used:   Print then Give to Patient   RxID:   [id number] FIORINAL/CODEINE #3 [id number] MG  CAPS (BUTALBITAL-ASA-CAFF-CODEINE) take two capsules by mouth as needed  for headachef  #60 x 3   Entered by:   Willy Eddy, LPN   Authorized by:   Stacie Glaze MD   Signed by:   Willy Eddy, LPN on 17/40/8144   Method used:   Print then Give to Patient   RxID:   [id number]

## 2010-12-24 ENCOUNTER — Other Ambulatory Visit: Payer: Self-pay | Admitting: Internal Medicine

## 2011-02-08 ENCOUNTER — Encounter: Payer: Self-pay | Admitting: *Deleted

## 2011-02-09 ENCOUNTER — Encounter: Payer: Self-pay | Admitting: *Deleted

## 2011-02-18 ENCOUNTER — Ambulatory Visit: Payer: Self-pay | Admitting: Internal Medicine

## 2011-02-21 ENCOUNTER — Other Ambulatory Visit: Payer: Self-pay | Admitting: *Deleted

## 2011-02-21 DIAGNOSIS — G8929 Other chronic pain: Secondary | ICD-10-CM

## 2011-02-21 MED ORDER — METHADONE HCL 40 MG PO TBSO: 40.0000 mg | ORAL_TABLET | Freq: Four times a day (QID) | ORAL | Status: DC

## 2011-02-21 MED ORDER — OXYCODONE HCL 30 MG PO TABS: 30.0000 mg | ORAL_TABLET | ORAL | Status: DC | PRN

## 2011-02-21 MED ORDER — FENTANYL 75 MCG/HR TD PT72: 1.0000 | MEDICATED_PATCH | TRANSDERMAL | Status: DC

## 2011-02-21 MED ORDER — OXYCODONE HCL 30 MG PO TABS: 30.0000 mg | ORAL_TABLET | Freq: Four times a day (QID) | ORAL | Status: DC | PRN

## 2011-03-04 ENCOUNTER — Encounter: Payer: Self-pay | Admitting: Internal Medicine

## 2011-03-04 ENCOUNTER — Ambulatory Visit (INDEPENDENT_AMBULATORY_CARE_PROVIDER_SITE_OTHER): Payer: BC Managed Care – PPO | Admitting: Internal Medicine

## 2011-03-04 DIAGNOSIS — M545 Low back pain, unspecified: Secondary | ICD-10-CM

## 2011-03-04 DIAGNOSIS — M549 Dorsalgia, unspecified: Secondary | ICD-10-CM

## 2011-03-04 DIAGNOSIS — E781 Pure hyperglyceridemia: Secondary | ICD-10-CM

## 2011-03-04 DIAGNOSIS — M5126 Other intervertebral disc displacement, lumbar region: Secondary | ICD-10-CM

## 2011-03-04 LAB — TRIGLYCERIDES: Triglycerides: 642 mg/dL — ABNORMAL HIGH (ref <150)

## 2011-03-04 NOTE — Progress Notes (Signed)
  Subjective:    Patient ID: Adrian Ward, male    DOB: 1966/04/16, 45 y.o.   MRN: [id number]  HPI Patient has increased pain in the low back concerned that he needs aconsult with the neurosurgeon and possibly repeat imaging studies. Due to chronicity of his back pain I believe we can go ahead and order an MRI of his back prior to seeing the neurosurgeon.    Review of Systems     Objective:   Physical Exam        Assessment & Plan:

## 2011-05-04 ENCOUNTER — Ambulatory Visit (INDEPENDENT_AMBULATORY_CARE_PROVIDER_SITE_OTHER): Payer: BC Managed Care – PPO | Admitting: Internal Medicine

## 2011-05-04 ENCOUNTER — Encounter: Payer: Self-pay | Admitting: Internal Medicine

## 2011-05-04 VITALS — BP 130/86 | HR 76 | Temp 98.6°F | Resp 16 | Ht 68.0 in | Wt 206.0 lb

## 2011-05-04 DIAGNOSIS — M5126 Other intervertebral disc displacement, lumbar region: Secondary | ICD-10-CM

## 2011-05-04 DIAGNOSIS — G8929 Other chronic pain: Secondary | ICD-10-CM

## 2011-05-04 DIAGNOSIS — F17200 Nicotine dependence, unspecified, uncomplicated: Secondary | ICD-10-CM

## 2011-05-04 DIAGNOSIS — Z79899 Other long term (current) drug therapy: Secondary | ICD-10-CM

## 2011-05-04 DIAGNOSIS — E8881 Metabolic syndrome: Secondary | ICD-10-CM

## 2011-05-04 DIAGNOSIS — M549 Dorsalgia, unspecified: Secondary | ICD-10-CM

## 2011-05-04 DIAGNOSIS — T887XXA Unspecified adverse effect of drug or medicament, initial encounter: Secondary | ICD-10-CM

## 2011-05-04 LAB — BASIC METABOLIC PANEL
BUN: 15 mg/dL (ref 6–23)
CO2: 31 mEq/L (ref 19–32)
Calcium: 9.1 mg/dL (ref 8.4–10.5)
Chloride: 104 mEq/L (ref 96–112)
Creatinine, Ser: 0.7 mg/dL (ref 0.4–1.5)

## 2011-05-04 LAB — HEPATIC FUNCTION PANEL
Bilirubin, Direct: 0 mg/dL (ref 0.0–0.3)
Total Bilirubin: 0.3 mg/dL (ref 0.3–1.2)

## 2011-05-04 LAB — HEMOGLOBIN A1C: Hgb A1c MFr Bld: 6.4 % (ref 4.6–6.5)

## 2011-05-04 MED ORDER — METHADONE HCL 40 MG PO TBSO: 40.0000 mg | ORAL_TABLET | Freq: Four times a day (QID) | ORAL | Status: DC

## 2011-05-04 MED ORDER — FENTANYL 75 MCG/HR TD PT72: 1.0000 | MEDICATED_PATCH | TRANSDERMAL | Status: DC

## 2011-05-04 MED ORDER — OXYCODONE HCL 30 MG PO TABS: 30.0000 mg | ORAL_TABLET | Freq: Four times a day (QID) | ORAL | Status: DC | PRN

## 2011-05-04 MED ORDER — ALPRAZOLAM ER 2 MG PO TB24: 2.0000 mg | ORAL_TABLET | Freq: Two times a day (BID) | ORAL | Status: DC

## 2011-05-04 MED ORDER — OXYCODONE HCL 30 MG PO TABS: 30.0000 mg | ORAL_TABLET | ORAL | Status: DC | PRN

## 2011-05-04 NOTE — Progress Notes (Signed)
  Subjective:    Patient ID: Adrian Ward, male    DOB: May 04, 1966, 45 y.o.   MRN: [id number]  HPI patient is a 45 year old male with chronic pain on chronic pain management DVTs back disease he is also followed by Dr. Channing Mutters, a neurosurgeon.  At a recent MRI but he does not know the results he has followup scheduled with his neurosurgeon he presents today for chronic pain management and refill of prescriptions per our protocol and contract and also to discuss his continued tobacco abuse and tobacco cessation and to monitor his weight he is on a weight loss program due to his risk for diabetes he is despite a box and him and we have discussed that if he cannot lose weight he would have to take medication for diabetes.      Review of Systems  Constitutional: Negative for fever and fatigue.  HENT: Negative for hearing loss, congestion, neck pain and postnasal drip.   Eyes: Negative for discharge, redness and visual disturbance.  Respiratory: Negative for cough, shortness of breath and wheezing.   Cardiovascular: Negative for leg swelling.  Gastrointestinal: Negative for abdominal pain, constipation and abdominal distention.  Genitourinary: Negative for urgency and frequency.  Musculoskeletal: Negative for joint swelling and arthralgias.  Skin: Negative for color change and rash.  Neurological: Negative for weakness and light-headedness.  Hematological: Negative for adenopathy.  Psychiatric/Behavioral: Negative for behavioral problems.       Past Medical History  Diagnosis Date  . Chronic back pain   . Cervical disc disease   . Hyperlipidemia   . Cervical arthritis   . Headache   . Hypertriglyceridemia    Past Surgical History  Procedure Date  . Epidural block injection     dr Channing Mutters  . Lumbar laminectomy   . Lumbar fusion     reports that he has been smoking.  He does not have any smokeless tobacco history on file. He reports that he does not drink alcohol or use illicit  drugs. family history includes Alcohol abuse in his father; Cancer in his mother; Hypertension in his father; and Liver disease in his father. No Known Allergies  Objective:   Physical Exam  Constitutional: He appears well-developed and well-nourished.  HENT:  Head: Normocephalic and atraumatic.  Eyes: Conjunctivae are normal. Pupils are equal, round, and reactive to light.  Neck: Normal range of motion. Neck supple.  Cardiovascular: Normal rate and regular rhythm.   Pulmonary/Chest: Effort normal and breath sounds normal.  Abdominal: Soft. Bowel sounds are normal.          Assessment & Plan:  His chronic back pain has worsened however we will continue his current medications and to hear from the neurosurgeon as to what the options are open to expedite any injection therapy he may need however if surgery is the option and that will be scheduled with his neurosurgeon.  His weight loss has been encouraging we will monitor hemoglobin A1c today to confirm that his blood sugars in better control we urged a goal weight of less than 200.  We counseled the patient about tobacco use and abuse and the need for cessation

## 2011-07-25 ENCOUNTER — Ambulatory Visit: Payer: BC Managed Care – PPO | Admitting: Internal Medicine

## 2011-08-08 ENCOUNTER — Ambulatory Visit (INDEPENDENT_AMBULATORY_CARE_PROVIDER_SITE_OTHER): Payer: BC Managed Care – PPO | Admitting: Internal Medicine

## 2011-08-08 VITALS — BP 124/80 | HR 72 | Temp 98.2°F | Resp 16 | Ht 68.0 in | Wt 214.0 lb

## 2011-08-08 DIAGNOSIS — M549 Dorsalgia, unspecified: Secondary | ICD-10-CM

## 2011-08-08 DIAGNOSIS — N411 Chronic prostatitis: Secondary | ICD-10-CM

## 2011-08-08 DIAGNOSIS — T887XXA Unspecified adverse effect of drug or medicament, initial encounter: Secondary | ICD-10-CM

## 2011-08-08 DIAGNOSIS — Z23 Encounter for immunization: Secondary | ICD-10-CM

## 2011-08-08 DIAGNOSIS — G8929 Other chronic pain: Secondary | ICD-10-CM

## 2011-08-08 MED ORDER — METHADONE HCL 40 MG PO TBSO: 40.0000 mg | ORAL_TABLET | Freq: Four times a day (QID) | ORAL | Status: DC

## 2011-08-08 MED ORDER — ALPRAZOLAM ER 2 MG PO TB24: 2.0000 mg | ORAL_TABLET | Freq: Two times a day (BID) | ORAL | Status: DC

## 2011-08-08 MED ORDER — CIPROFLOXACIN HCL 500 MG PO TABS: 500.0000 mg | ORAL_TABLET | Freq: Two times a day (BID) | ORAL | Status: AC

## 2011-08-08 MED ORDER — OXYCODONE HCL 30 MG PO TABS: 30.0000 mg | ORAL_TABLET | Freq: Four times a day (QID) | ORAL | Status: DC | PRN

## 2011-08-08 MED ORDER — BUTALBITAL-ASA-CAFF-CODEINE 50-325-40-30 MG PO CAPS: 1.0000 | ORAL_CAPSULE | ORAL | Status: DC | PRN

## 2011-08-08 MED ORDER — FENTANYL 75 MCG/HR TD PT72: 1.0000 | MEDICATED_PATCH | TRANSDERMAL | Status: DC

## 2011-08-08 NOTE — Patient Instructions (Signed)
Patient was instructed to continue all medications as prescribed. To stop at the checkout desk and schedule a followup appointment  

## 2011-08-08 NOTE — Progress Notes (Signed)
  Subjective:    Patient ID: Adrian Ward, male    DOB: [birth year], 45 y.o.   MRN: [id number]  HPI Weight gain noted Resumed smoking chronic pain follow up Dysuria and increased back pain No fever or discharge noted   Review of Systems  Constitutional: Negative for fever and fatigue.  HENT: Negative for hearing loss, congestion, neck pain and postnasal drip.   Eyes: Negative for discharge, redness and visual disturbance.  Respiratory: Negative for cough, shortness of breath and wheezing.   Cardiovascular: Negative for leg swelling.  Gastrointestinal: Negative for abdominal pain, constipation and abdominal distention.  Genitourinary: Negative for urgency and frequency.  Musculoskeletal: Positive for myalgias, back pain, joint swelling, arthralgias and gait problem.  Skin: Negative for color change and rash.  Neurological: Negative for weakness and light-headedness.  Hematological: Negative for adenopathy.  Psychiatric/Behavioral: Negative for behavioral problems.   Past Medical History  Diagnosis Date  . Chronic back pain   . Cervical disc disease   . Hyperlipidemia   . Cervical arthritis   . Headache   . Hypertriglyceridemia    Past Surgical History  Procedure Date  . Epidural block injection     dr Channing Mutters  . Lumbar laminectomy   . Lumbar fusion     reports that he has been smoking.  He does not have any smokeless tobacco history on file. He reports that he does not drink alcohol or use illicit drugs. family history includes Alcohol abuse in his father; Cancer in his mother; Hypertension in his father; and Liver disease in his father. No Known Allergies     Objective:   Physical Exam  Nursing note and vitals reviewed. Constitutional: He appears well-developed and well-nourished.  HENT:  Head: Normocephalic and atraumatic.  Eyes: Conjunctivae are normal. Pupils are equal, round, and reactive to light.  Neck: Normal range of motion. Neck supple.  Cardiovascular:  Normal rate and regular rhythm.   Pulmonary/Chest: Effort normal and breath sounds normal.  Abdominal: Soft. Bowel sounds are normal. He exhibits distension. There is tenderness.  Musculoskeletal: He exhibits edema and tenderness.          Assessment & Plan:  Tried the patches and the 21 mg was too strong Recommended trying 1/2 a patch to quit smoking Prostate symptoms are consistent with recurrent prostatitis Will send in cipro 500 BID  For 14 days Will refill chronic pain medications per protocols

## 2011-09-08 ENCOUNTER — Telehealth: Payer: Self-pay | Admitting: *Deleted

## 2011-09-08 MED ORDER — CIPROFLOXACIN HCL 500 MG PO TABS: 500.0000 mg | ORAL_TABLET | Freq: Two times a day (BID) | ORAL | Status: AC

## 2011-09-08 NOTE — Telephone Encounter (Signed)
Pt is having recurrent symptoms of kidney stones/prostatic infection.  Cipro 500 mg one po bid x 21 days, and see Dr. Lovell Sheehan in the am per Dr. Lovell Sheehan.

## 2011-09-09 ENCOUNTER — Ambulatory Visit (INDEPENDENT_AMBULATORY_CARE_PROVIDER_SITE_OTHER): Payer: BC Managed Care – PPO | Admitting: Internal Medicine

## 2011-09-09 ENCOUNTER — Encounter: Payer: Self-pay | Admitting: Internal Medicine

## 2011-09-09 VITALS — BP 140/90 | HR 116 | Temp 98.8°F | Resp 16 | Ht 68.0 in | Wt 216.0 lb

## 2011-09-09 DIAGNOSIS — R109 Unspecified abdominal pain: Secondary | ICD-10-CM

## 2011-09-09 DIAGNOSIS — R1030 Lower abdominal pain, unspecified: Secondary | ICD-10-CM

## 2011-09-09 LAB — POCT URINALYSIS DIPSTICK
Glucose, UA: NEGATIVE
Leukocytes, UA: NEGATIVE
Nitrite, UA: NEGATIVE
Protein, UA: NEGATIVE
Spec Grav, UA: 1.01
Urobilinogen, UA: 0.2

## 2011-09-09 MED ORDER — CEFTRIAXONE SODIUM 500 MG IJ SOLR
500.0000 mg | Freq: Once | INTRAMUSCULAR | Status: AC
  Administered 2011-09-09: 500 mg via INTRAMUSCULAR

## 2011-09-09 NOTE — Patient Instructions (Signed)
Patient was instructed to continue all medications as prescribed. To stop at the checkout desk and schedule a followup appointment  

## 2011-09-09 NOTE — Progress Notes (Signed)
  Subjective:    Patient ID: Adrian Ward, male    DOB: 1966/09/10, 45 y.o.   MRN: [id number]  HPI Back and testicular pain No dysuria Started cipro and this has helped History of prostatitis.  History of renal stones.   Review of Systems  Constitutional: Negative for fever and fatigue.  HENT: Negative for hearing loss, congestion, neck pain and postnasal drip.   Eyes: Negative for discharge, redness and visual disturbance.  Respiratory: Negative for cough, shortness of breath and wheezing.   Cardiovascular: Negative for leg swelling.  Gastrointestinal: Negative for abdominal pain, constipation and abdominal distention.  Genitourinary: Positive for frequency, flank pain and testicular pain. Negative for urgency.  Musculoskeletal: Negative for joint swelling and arthralgias.  Skin: Negative for color change and rash.  Neurological: Negative for weakness and light-headedness.  Hematological: Negative for adenopathy.  Psychiatric/Behavioral: Negative for behavioral problems.   Past Medical History  Diagnosis Date  . Chronic back pain   . Cervical disc disease   . Hyperlipidemia   . Cervical arthritis   . Headache   . Hypertriglyceridemia    Past Surgical History  Procedure Date  . Epidural block injection     dr Channing Mutters  . Lumbar laminectomy   . Lumbar fusion     reports that he has been smoking.  He does not have any smokeless tobacco history on file. He reports that he does not drink alcohol or use illicit drugs. family history includes Alcohol abuse in his father; Cancer in his mother; Hypertension in his father; and Liver disease in his father. No Known Allergies     Objective:   Physical Exam  Constitutional: He appears well-developed and well-nourished.  HENT:  Head: Normocephalic and atraumatic.  Eyes: Conjunctivae are normal. Pupils are equal, round, and reactive to light.  Neck: Normal range of motion. Neck supple.  Cardiovascular: Normal rate and regular  rhythm.   Pulmonary/Chest: Effort normal and breath sounds normal.  Abdominal: Soft. Bowel sounds are normal.          Assessment & Plan:   probable acute prostatitis but has back pain and has testicular pain. Continue the ciprofloxacin 500 twice a day for 14 days we'll give 500 mg of Rocephin IM due to the severity of his symptoms and nausea associated with this he needs to keep down by mouth fluids and stay hydrated and be able to continue the oral antibiotics

## 2011-09-09 NOTE — Progress Notes (Signed)
Addended by: Willy Eddy on: 09/09/2011 10:31 AM   Modules accepted: Orders

## 2011-11-07 ENCOUNTER — Encounter: Payer: Self-pay | Admitting: Internal Medicine

## 2011-11-11 ENCOUNTER — Encounter: Payer: Self-pay | Admitting: Internal Medicine

## 2011-11-11 ENCOUNTER — Ambulatory Visit (INDEPENDENT_AMBULATORY_CARE_PROVIDER_SITE_OTHER): Payer: BC Managed Care – PPO | Admitting: Internal Medicine

## 2011-11-11 VITALS — BP 134/80 | HR 88 | Temp 98.2°F | Resp 16 | Ht 68.0 in | Wt 221.0 lb

## 2011-11-11 DIAGNOSIS — M5137 Other intervertebral disc degeneration, lumbosacral region: Secondary | ICD-10-CM

## 2011-11-11 DIAGNOSIS — K219 Gastro-esophageal reflux disease without esophagitis: Secondary | ICD-10-CM

## 2011-11-11 DIAGNOSIS — M549 Dorsalgia, unspecified: Secondary | ICD-10-CM

## 2011-11-11 DIAGNOSIS — G8929 Other chronic pain: Secondary | ICD-10-CM

## 2011-11-11 DIAGNOSIS — F17200 Nicotine dependence, unspecified, uncomplicated: Secondary | ICD-10-CM

## 2011-11-11 MED ORDER — FENTANYL 75 MCG/HR TD PT72: 1.0000 | MEDICATED_PATCH | TRANSDERMAL | Status: DC

## 2011-11-11 MED ORDER — OXYCODONE HCL 30 MG PO TABS: 30.0000 mg | ORAL_TABLET | ORAL | Status: DC | PRN

## 2011-11-11 MED ORDER — METHADONE HCL 40 MG PO TBSO: 40.0000 mg | ORAL_TABLET | Freq: Four times a day (QID) | ORAL | Status: DC

## 2011-11-11 NOTE — Progress Notes (Signed)
Subjective:    Patient ID: Adrian Ward, male    DOB: June 26, 1966, 46 y.o.   MRN: [id number]  HPI  Prostate symptoms much improved.  Patient presents for renewal of his medications for chronic back pain blood pressure today is stable weight is stable monitoring for risk of diabetes given his family history continued tobacco abuse counseling about cessation  GERD stable  Has increased weight  Review of Systems  Constitutional: Negative for fever and fatigue.  HENT: Negative for hearing loss, congestion, neck pain and postnasal drip.   Eyes: Negative for discharge, redness and visual disturbance.  Respiratory: Negative for cough, shortness of breath and wheezing.   Cardiovascular: Negative for leg swelling.  Gastrointestinal: Negative for abdominal pain, constipation and abdominal distention.  Genitourinary: Negative for urgency and frequency.  Musculoskeletal: Negative for joint swelling and arthralgias.  Skin: Negative for color change and rash.  Neurological: Negative for weakness and light-headedness.  Hematological: Negative for adenopathy.  Psychiatric/Behavioral: Negative for behavioral problems.   Past Medical History  Diagnosis Date  . Chronic back pain   . Cervical disc disease   . Hyperlipidemia   . Cervical arthritis   . Headache   . Hypertriglyceridemia     History   Social History  . Marital Status: Married    Spouse Name: N/A    Number of Children: N/A  . Years of Education: N/A   Occupational History  . Not on file.   Social History Main Topics  . Smoking status: Current Everyday Smoker -- 0.5 packs/day  . Smokeless tobacco: Not on file  . Alcohol Use: No  . Drug Use: No  . Sexually Active: Yes   Other Topics Concern  . Not on file   Social History Narrative  . No narrative on file    Past Surgical History  Procedure Date  . Epidural block injection     dr Channing Mutters  . Lumbar laminectomy   . Lumbar fusion     Family History  Problem  Relation Age of Onset  . Cancer Mother     breast/ metastatic  . Hypertension Father   . Liver disease Father   . Alcohol abuse Father     No Known Allergies  Current Outpatient Prescriptions on File Prior to Visit  Medication Sig Dispense Refill  . ALPRAZolam (XANAX XR) 2 MG 24 hr tablet Take 1 tablet (2 mg total) by mouth 2 (two) times daily.  60 tablet  5  . betamethasone (CELESTONE) 0.6 MG/5ML solution Take 1.2 mg by mouth 2 (two) times daily.        . betamethasone, augmented, (DIPROLENE) 0.05 % lotion Apply topically daily. qpply mixture to dry skin 2 times a day--1/4 betamethasone lotion with 3/4 eucerinelotion       . butalbital-aspirin-caffeine-codeine (FIORINAL WITH CODEINE) 50-325-40-30 MG capsule Take 1 capsule by mouth every 4 (four) hours as needed.  30 capsule  3  . Cholecalciferol (VITAMIN D3) 400 UNITS CAPS Take by mouth daily.        . fenofibrate micronized (LOFIBRA) 134 MG capsule Take 134 mg by mouth daily before breakfast.        . omeprazole (PRILOSEC) 20 MG capsule Take 20 mg by mouth daily.        . vitamin E 400 UNIT capsule Take 400 Units by mouth daily.          BP 134/80  Pulse 88  Temp 98.2 F (36.8 C)  Resp 16  Ht 5\' 8"  (  1.727 m)  Wt 221 lb (100.245 kg)  BMI 33.60 kg/m2       Objective:   Physical Exam  Nursing note and vitals reviewed. Constitutional: He appears well-developed and well-nourished.  HENT:  Head: Normocephalic and atraumatic.  Eyes: Conjunctivae are normal. Pupils are equal, round, and reactive to light.  Neck: Normal range of motion. Neck supple.  Cardiovascular: Normal rate and regular rhythm.   Pulmonary/Chest: Effort normal and breath sounds normal.  Abdominal: Soft. Bowel sounds are normal.          Assessment & Plan:  Refilled chronic pain medicines for the next 9 days.  Discussed tobacco cessation.  Discussed weight loss and reduction of gastroesophageal reflux  Resolved prostatitis

## 2011-11-11 NOTE — Patient Instructions (Signed)
The patient is instructed to continue all medications as prescribed. Schedule followup with check out clerk upon leaving the clinic  

## 2012-02-10 ENCOUNTER — Ambulatory Visit (INDEPENDENT_AMBULATORY_CARE_PROVIDER_SITE_OTHER): Payer: BC Managed Care – PPO | Admitting: Internal Medicine

## 2012-02-10 ENCOUNTER — Encounter: Payer: Self-pay | Admitting: Internal Medicine

## 2012-02-10 ENCOUNTER — Other Ambulatory Visit: Payer: Self-pay | Admitting: *Deleted

## 2012-02-10 VITALS — BP 134/84 | HR 76 | Temp 98.2°F | Resp 16 | Ht 68.0 in | Wt 218.0 lb

## 2012-02-10 DIAGNOSIS — M6740 Ganglion, unspecified site: Secondary | ICD-10-CM

## 2012-02-10 DIAGNOSIS — M549 Dorsalgia, unspecified: Secondary | ICD-10-CM

## 2012-02-10 DIAGNOSIS — G8929 Other chronic pain: Secondary | ICD-10-CM

## 2012-02-10 DIAGNOSIS — M67439 Ganglion, unspecified wrist: Secondary | ICD-10-CM

## 2012-02-10 MED ORDER — FENTANYL 75 MCG/HR TD PT72: 1.0000 | MEDICATED_PATCH | TRANSDERMAL | Status: DC

## 2012-02-10 MED ORDER — OXYCODONE HCL 30 MG PO TABS: 30.0000 mg | ORAL_TABLET | ORAL | Status: DC | PRN

## 2012-02-10 MED ORDER — METHADONE HCL 40 MG PO TBSO: 40.0000 mg | ORAL_TABLET | Freq: Four times a day (QID) | ORAL | Status: DC

## 2012-02-10 MED ORDER — METHYLPREDNISOLONE ACETATE 40 MG/ML IJ SUSP: 40.0000 mg | Freq: Once | INTRAMUSCULAR | Status: DC

## 2012-02-10 MED ORDER — ALPRAZOLAM ER 2 MG PO TB24: 2.0000 mg | ORAL_TABLET | Freq: Two times a day (BID) | ORAL | Status: DC

## 2012-02-10 NOTE — Progress Notes (Signed)
Subjective:    Patient ID: Adrian Ward, male    DOB: 1966-09-02, 46 y.o.   MRN: [id number]  HPI  patient presents today for followup of medication management for chronic pain.  He continues to smoke.  He has a chief complaint today of pain in the dorsal surface of the wrist bilaterally is worse with flexion or pressure such as pushing off the surface no apparent swelling to the wrist no redness and he has no history of trauma. He is stable his current medications we'll refill his methadone and oxycodone per protocol.  Blood pressure is stable     Review of Systems  Constitutional: Negative for fever and fatigue.  HENT: Negative for hearing loss, congestion, neck pain and postnasal drip.   Eyes: Negative for discharge, redness and visual disturbance.  Respiratory: Positive for apnea. Negative for cough, shortness of breath and wheezing.   Cardiovascular: Negative for leg swelling.  Gastrointestinal: Negative for abdominal pain, constipation and abdominal distention.  Genitourinary: Negative for urgency and frequency.  Musculoskeletal: Positive for myalgias, joint swelling and arthralgias.  Skin: Negative for color change and rash.  Neurological: Negative for weakness and light-headedness.  Hematological: Negative for adenopathy.  Psychiatric/Behavioral: Negative for behavioral problems.   Past Medical History  Diagnosis Date  . Chronic back pain   . Cervical disc disease   . Hyperlipidemia   . Cervical arthritis   . Headache   . Hypertriglyceridemia     History   Social History  . Marital Status: Married    Spouse Name: N/A    Number of Children: N/A  . Years of Education: N/A   Occupational History  . Not on file.   Social History Main Topics  . Smoking status: Current Everyday Smoker -- 0.5 packs/day  . Smokeless tobacco: Not on file  . Alcohol Use: No  . Drug Use: No  . Sexually Active: Yes   Other Topics Concern  . Not on file   Social History  Narrative  . No narrative on file    Past Surgical History  Procedure Date  . Epidural block injection     dr Channing Mutters  . Lumbar laminectomy   . Lumbar fusion     Family History  Problem Relation Age of Onset  . Cancer Mother     breast/ metastatic  . Hypertension Father   . Liver disease Father   . Alcohol abuse Father     No Known Allergies  Current Outpatient Prescriptions on File Prior to Visit  Medication Sig Dispense Refill  . betamethasone (CELESTONE) 0.6 MG/5ML solution Take 1.2 mg by mouth 2 (two) times daily.        . betamethasone, augmented, (DIPROLENE) 0.05 % lotion Apply topically daily. qpply mixture to dry skin 2 times a day--1/4 betamethasone lotion with 3/4 eucerinelotion       . butalbital-aspirin-caffeine-codeine (FIORINAL WITH CODEINE) 50-325-40-30 MG capsule Take 1 capsule by mouth every 4 (four) hours as needed.  30 capsule  3  . Cholecalciferol (VITAMIN D3) 400 UNITS CAPS Take by mouth daily.        . fenofibrate micronized (LOFIBRA) 134 MG capsule Take 134 mg by mouth daily before breakfast.        . fentaNYL (DURAGESIC - DOSED MCG/HR) 75 MCG/HR Place 1 patch (75 mcg total) onto the skin every 3 (three) days.  10 patch  0  . fentaNYL (DURAGESIC - DOSED MCG/HR) 75 MCG/HR Place 1 patch (75 mcg total) onto the skin every  3 (three) days.  10 patch  0  . omeprazole (PRILOSEC) 20 MG capsule Take 20 mg by mouth daily.        . vitamin E 400 UNIT capsule Take 400 Units by mouth daily.          BP 134/84  Pulse 76  Temp 98.2 F (36.8 C)  Resp 16  Ht 5\' 8"  (1.727 m)  Wt 218 lb (98.884 kg)  BMI 33.15 kg/m2       Objective:   Physical Exam  Nursing note and vitals reviewed. Constitutional: He appears well-developed and well-nourished.  HENT:  Head: Normocephalic and atraumatic.  Eyes: Conjunctivae are normal. Pupils are equal, round, and reactive to light.  Neck: Normal range of motion. Neck supple.  Cardiovascular: Normal rate and regular rhythm.     Pulmonary/Chest: Effort normal and breath sounds normal.  Abdominal: Soft. Bowel sounds are normal.  Musculoskeletal: He exhibits edema and tenderness.          Assessment & Plan:   Informed consent obtained and the patient's right wrist was prepped with betadine. Local anesthesia was obtained with topical spray. Then 40 mg of Depo-Medrol and 1/2 cc of lidocaine was injected into the joint space. The patient tolerated the procedure without complications. Post injection care discussed with patient.   Stable refill of pain medication per pain medication protocol.

## 2012-04-10 ENCOUNTER — Other Ambulatory Visit: Payer: Self-pay | Admitting: *Deleted

## 2012-04-10 MED ORDER — BUTALBITAL-ASA-CAFF-CODEINE 50-325-40-30 MG PO CAPS: 1.0000 | ORAL_CAPSULE | ORAL | Status: DC | PRN

## 2012-05-11 ENCOUNTER — Ambulatory Visit: Payer: BC Managed Care – PPO | Admitting: Internal Medicine

## 2012-05-14 ENCOUNTER — Ambulatory Visit (INDEPENDENT_AMBULATORY_CARE_PROVIDER_SITE_OTHER): Payer: BC Managed Care – PPO | Admitting: Internal Medicine

## 2012-05-14 ENCOUNTER — Other Ambulatory Visit: Payer: Self-pay | Admitting: *Deleted

## 2012-05-14 ENCOUNTER — Encounter: Payer: Self-pay | Admitting: Internal Medicine

## 2012-05-14 VITALS — BP 134/80 | HR 84 | Temp 98.2°F | Resp 16 | Ht 67.0 in | Wt 214.0 lb

## 2012-05-14 DIAGNOSIS — M549 Dorsalgia, unspecified: Secondary | ICD-10-CM

## 2012-05-14 DIAGNOSIS — F17200 Nicotine dependence, unspecified, uncomplicated: Secondary | ICD-10-CM

## 2012-05-14 DIAGNOSIS — M25569 Pain in unspecified knee: Secondary | ICD-10-CM

## 2012-05-14 DIAGNOSIS — M5137 Other intervertebral disc degeneration, lumbosacral region: Secondary | ICD-10-CM

## 2012-05-14 DIAGNOSIS — G8929 Other chronic pain: Secondary | ICD-10-CM

## 2012-05-14 MED ORDER — FENTANYL 75 MCG/HR TD PT72: 1.0000 | MEDICATED_PATCH | TRANSDERMAL | Status: DC

## 2012-05-14 MED ORDER — METHADONE HCL 40 MG PO TBSO: 40.0000 mg | ORAL_TABLET | Freq: Four times a day (QID) | ORAL | Status: DC

## 2012-05-14 MED ORDER — OXYCODONE HCL 30 MG PO TABS: 30.0000 mg | ORAL_TABLET | ORAL | Status: DC | PRN

## 2012-05-14 MED ORDER — ALPRAZOLAM ER 2 MG PO TB24: 2.0000 mg | ORAL_TABLET | Freq: Two times a day (BID) | ORAL | Status: DC

## 2012-05-14 MED ORDER — GLUCOSAMINE-CHONDROITIN 500-400 MG PO TABS: 1.0000 | ORAL_TABLET | Freq: Three times a day (TID) | ORAL | Status: AC

## 2012-05-14 NOTE — Patient Instructions (Addendum)
The patient is instructed to continue all medications as prescribed. Schedule followup with check out clerk upon leaving the clinic  

## 2012-05-14 NOTE — Progress Notes (Signed)
Subjective:    Patient ID: Adrian Ward, male    DOB: [birth year], 46 y.o.   MRN: [id number]  HPI Patient is a 46 year old male who presents for chronic pain management.  He presents per protocol the have his pain medicines filled he has been compliant with protocol and has not had any violations of his contract.  He is also seen for moderate to mild obesity with this complicates this metabolic syndrome hyperlipidemia and hypertension.     Review of Systems  Constitutional: Negative for fever and fatigue.  HENT: Negative for hearing loss, congestion, neck pain and postnasal drip.   Eyes: Negative for discharge, redness and visual disturbance.  Respiratory: Negative for cough, shortness of breath and wheezing.   Cardiovascular: Positive for leg swelling.  Gastrointestinal: Negative for abdominal pain, constipation and abdominal distention.  Genitourinary: Negative for urgency and frequency.  Musculoskeletal: Positive for myalgias, back pain, joint swelling, arthralgias and gait problem.  Skin: Negative for color change and rash.  Neurological: Negative for weakness and light-headedness.  Hematological: Negative for adenopathy.  Psychiatric/Behavioral: Negative for behavioral problems.   Past Medical History  Diagnosis Date  . Chronic back pain   . Cervical disc disease   . Hyperlipidemia   . Cervical arthritis   . Headache   . Hypertriglyceridemia     History   Social History  . Marital Status: Married    Spouse Name: N/A    Number of Children: N/A  . Years of Education: N/A   Occupational History  . Not on file.   Social History Main Topics  . Smoking status: Current Everyday Smoker -- 0.5 packs/day  . Smokeless tobacco: Not on file  . Alcohol Use: No  . Drug Use: No  . Sexually Active: Yes   Other Topics Concern  . Not on file   Social History Narrative  . No narrative on file    Past Surgical History  Procedure Date  . Epidural block injection     dr Channing Mutters  . Lumbar laminectomy   . Lumbar fusion     Family History  Problem Relation Age of Onset  . Cancer Mother     breast/ metastatic  . Hypertension Father   . Liver disease Father   . Alcohol abuse Father     No Known Allergies  Current Outpatient Prescriptions on File Prior to Visit  Medication Sig Dispense Refill  . betamethasone (CELESTONE) 0.6 MG/5ML solution Take 1.2 mg by mouth 2 (two) times daily.        . betamethasone, augmented, (DIPROLENE) 0.05 % lotion Apply topically daily. qpply mixture to dry skin 2 times a day--1/4 betamethasone lotion with 3/4 eucerinelotion       . butalbital-aspirin-caffeine-codeine (FIORINAL WITH CODEINE) 50-325-40-30 MG capsule Take 1 capsule by mouth every 4 (four) hours as needed.  30 capsule  2  . Cholecalciferol (VITAMIN D3) 400 UNITS CAPS Take by mouth daily.        . fenofibrate micronized (LOFIBRA) 134 MG capsule Take 134 mg by mouth daily before breakfast.        . omeprazole (PRILOSEC) 20 MG capsule Take 20 mg by mouth daily.        . vitamin E 400 UNIT capsule Take 400 Units by mouth daily.         Current Facility-Administered Medications on File Prior to Visit  Medication Dose Route Frequency Provider Last Rate Last Dose  . DISCONTD: methylPREDNISolone acetate (DEPO-MEDROL) injection 40 mg  40 mg  Intra-articular Once Stacie Glaze, MD        BP 134/80  Pulse 84  Temp 98.2 F (36.8 C)  Resp 16  Ht 5\' 7"  (1.702 m)  Wt 214 lb (97.07 kg)  BMI 33.52 kg/m2       Objective:   Physical Exam  Nursing note and vitals reviewed. Constitutional: He appears well-developed and well-nourished.  HENT:  Head: Normocephalic and atraumatic.  Eyes: Conjunctivae are normal. Pupils are equal, round, and reactive to light.  Neck: Normal range of motion. Neck supple.  Cardiovascular: Normal rate and regular rhythm.   Pulmonary/Chest: Effort normal and breath sounds normal.  Abdominal: Soft. Bowel sounds are normal.            Assessment & Plan:   Patient has chronic pain and is on pain medication refill per protocol all medications were refilled today per protocol for the next 90 days.  Patient has chronic central obesity which complicates hyperlipidemia this metabolic syndrome and hypertension we gave him a gluten-free diet and reviewed the principals with him  I have spent more than 30 minutes examining this patient face-to-face of which over half was spent in counseling

## 2012-08-07 ENCOUNTER — Other Ambulatory Visit (INDEPENDENT_AMBULATORY_CARE_PROVIDER_SITE_OTHER): Payer: BC Managed Care – PPO

## 2012-08-07 DIAGNOSIS — Z0000 Encounter for general adult medical examination without abnormal findings: Secondary | ICD-10-CM

## 2012-08-07 LAB — HEPATIC FUNCTION PANEL
Alkaline Phosphatase: 87 U/L (ref 39–117)
Bilirubin, Direct: 0 mg/dL (ref 0.0–0.3)
Total Bilirubin: 0.1 mg/dL — ABNORMAL LOW (ref 0.3–1.2)

## 2012-08-07 LAB — POCT URINALYSIS DIPSTICK
Bilirubin, UA: NEGATIVE
Glucose, UA: NEGATIVE
Ketones, UA: NEGATIVE
Leukocytes, UA: NEGATIVE
Nitrite, UA: NEGATIVE
pH, UA: 5.5

## 2012-08-07 LAB — BASIC METABOLIC PANEL
BUN: 15 mg/dL (ref 6–23)
CO2: 30 mEq/L (ref 19–32)
Chloride: 99 mEq/L (ref 96–112)
Creatinine, Ser: 0.9 mg/dL (ref 0.4–1.5)

## 2012-08-07 LAB — TSH: TSH: 2.42 u[IU]/mL (ref 0.35–5.50)

## 2012-08-07 LAB — CBC WITH DIFFERENTIAL/PLATELET
Basophils Absolute: 0 10*3/uL (ref 0.0–0.1)
Eosinophils Absolute: 0.2 10*3/uL (ref 0.0–0.7)
MCHC: 32.7 g/dL (ref 30.0–36.0)
MCV: 89.7 fl (ref 78.0–100.0)
Monocytes Absolute: 0.6 10*3/uL (ref 0.1–1.0)
Neutrophils Relative %: 51.7 % (ref 43.0–77.0)
Platelets: 195 10*3/uL (ref 150.0–400.0)
RDW: 13.8 % (ref 11.5–14.6)

## 2012-08-07 LAB — LIPID PANEL
Cholesterol: 202 mg/dL — ABNORMAL HIGH (ref 0–200)
Triglycerides: 901 mg/dL — ABNORMAL HIGH (ref 0.0–149.0)

## 2012-08-15 ENCOUNTER — Encounter: Payer: BC Managed Care – PPO | Admitting: Internal Medicine

## 2012-08-16 ENCOUNTER — Telehealth: Payer: Self-pay | Admitting: Internal Medicine

## 2012-08-16 NOTE — Telephone Encounter (Signed)
Pt called req refills for methadone (METHADOSE) 40 MG disintegrating tablet, fentaNYL (DURAGESIC - DOSED MCG/HR) 75 MCG/HR, oxycodone (ROXICODONE) 30 MG immediate release tablet

## 2012-08-17 ENCOUNTER — Other Ambulatory Visit: Payer: Self-pay | Admitting: *Deleted

## 2012-08-17 DIAGNOSIS — G8929 Other chronic pain: Secondary | ICD-10-CM

## 2012-08-17 DIAGNOSIS — M549 Dorsalgia, unspecified: Secondary | ICD-10-CM

## 2012-08-17 MED ORDER — OXYCODONE HCL 30 MG PO TABS: 30.0000 mg | ORAL_TABLET | ORAL | Status: DC | PRN

## 2012-08-17 MED ORDER — METHADONE HCL 40 MG PO TBSO: 40.0000 mg | ORAL_TABLET | Freq: Four times a day (QID) | ORAL | Status: DC

## 2012-08-17 MED ORDER — FENTANYL 75 MCG/HR TD PT72: 1.0000 | MEDICATED_PATCH | TRANSDERMAL | Status: DC

## 2012-08-17 NOTE — Telephone Encounter (Signed)
Printed and will call pt when ready for pick up after dr jenkins signs 

## 2012-08-21 ENCOUNTER — Other Ambulatory Visit: Payer: Self-pay | Admitting: *Deleted

## 2012-08-21 MED ORDER — BUTALBITAL-ASA-CAFF-CODEINE 50-325-40-30 MG PO CAPS: 1.0000 | ORAL_CAPSULE | ORAL | Status: DC | PRN

## 2012-09-11 ENCOUNTER — Ambulatory Visit (INDEPENDENT_AMBULATORY_CARE_PROVIDER_SITE_OTHER): Payer: BC Managed Care – PPO | Admitting: Internal Medicine

## 2012-09-11 VITALS — BP 120/82 | HR 80 | Temp 98.3°F | Resp 16 | Ht 67.0 in | Wt 230.0 lb

## 2012-09-11 DIAGNOSIS — Z0000 Encounter for general adult medical examination without abnormal findings: Secondary | ICD-10-CM

## 2012-09-11 DIAGNOSIS — Z23 Encounter for immunization: Secondary | ICD-10-CM

## 2012-09-11 NOTE — Patient Instructions (Addendum)
To get started with a gluten-free diet I would recommend you follow the simple pattern. In the morning had 3 eggs scrambled are as an omelette he can add vegetables and cheese to the omelette but make sure that she uses cheese that is not  Processed  For lunch have a grilled chicken breast or a hamburger steak or fish with a steam fresh vegetable pack   For dinner have a root vegetable like carrots or a take sweet potato or turnips or butternut squash and a vegetable and meat of your choice that can be fish shrimp  chicken or steak   No peanut butter no beans other than green beans no pasta bread or cereal he can have steel cut oatmeal  No salad dressings that are creamy because they contain gluten you can have oil and vinegar on a salad  For snack he can have all months or pistachios    stevia is the sweetener of choice

## 2012-09-24 ENCOUNTER — Ambulatory Visit: Payer: Self-pay | Admitting: Family

## 2012-09-24 ENCOUNTER — Encounter: Payer: Self-pay | Admitting: Family

## 2012-09-24 ENCOUNTER — Ambulatory Visit (INDEPENDENT_AMBULATORY_CARE_PROVIDER_SITE_OTHER): Payer: BC Managed Care – PPO | Admitting: Family

## 2012-09-24 ENCOUNTER — Telehealth: Payer: Self-pay | Admitting: Internal Medicine

## 2012-09-24 VITALS — BP 120/80 | HR 103 | Temp 98.4°F | Wt 226.0 lb

## 2012-09-24 DIAGNOSIS — J069 Acute upper respiratory infection, unspecified: Secondary | ICD-10-CM

## 2012-09-24 DIAGNOSIS — Z720 Tobacco use: Secondary | ICD-10-CM

## 2012-09-24 DIAGNOSIS — R05 Cough: Secondary | ICD-10-CM

## 2012-09-24 DIAGNOSIS — F17200 Nicotine dependence, unspecified, uncomplicated: Secondary | ICD-10-CM

## 2012-09-24 DIAGNOSIS — R059 Cough, unspecified: Secondary | ICD-10-CM

## 2012-09-24 MED ORDER — METHYLPREDNISOLONE 4 MG PO KIT: PACK | ORAL | Status: AC

## 2012-09-24 NOTE — Telephone Encounter (Signed)
Appt 11/18 to see Padonda.

## 2012-09-24 NOTE — Telephone Encounter (Signed)
Patient Information:  Caller Name: Masaji  Phone: [phone]  Patient: Adrian Ward, Adrian Ward  Gender: Male  DOB: 26-Jan-1966  Age: 46 Years  PCP: Darryll Capers (Adults only)   Symptoms  Reason For Call & Symptoms: over the w/e pt has had sore throat/swelling under his jaw/and ear pain  Reviewed Health History In EMR: Yes  Reviewed Medications In EMR: Yes  Reviewed Allergies In EMR: Yes  Date of Onset of Symptoms: 09/22/2012  Guideline(s) Used:  Sore Throat  Disposition Per Guideline:   See Today in Office  Reason For Disposition Reached:   Earache also present  Advice Given:  N/A  Office Follow Up:  Does the office need to follow up with this patient?: No  Instructions For The Office: N/A

## 2012-09-24 NOTE — Patient Instructions (Signed)

## 2012-09-25 ENCOUNTER — Encounter: Payer: Self-pay | Admitting: Family

## 2012-09-25 NOTE — Progress Notes (Signed)
Subjective:    Patient ID: Adrian Ward, male    DOB: 1966/01/02, 46 y.o.   MRN: [id number]  HPI  46 year old white male, smoker about half a pack per day, is in today with complaints of sore throat, sinus pressure, postnasal drip, cough and congestion x 2 days but improving. Hasn't taken over-the-counter ibuprofen and use and a nasal spray that has helped.  Review of Systems  Constitutional: Positive for fatigue.  HENT: Positive for nosebleeds, congestion, sneezing and postnasal drip.   Respiratory: Positive for cough. Negative for shortness of breath and wheezing.   Cardiovascular: Negative.   Musculoskeletal: Negative.   Skin: Negative.   Hematological: Negative.   Psychiatric/Behavioral: Negative.    Past Medical History  Diagnosis Date  . Chronic back pain   . Cervical disc disease   . Hyperlipidemia   . Cervical arthritis   . Headache   . Hypertriglyceridemia     History   Social History  . Marital Status: Married    Spouse Name: N/A    Number of Children: N/A  . Years of Education: N/A   Occupational History  . Not on file.   Social History Main Topics  . Smoking status: Current Every Day Smoker -- 0.5 packs/day  . Smokeless tobacco: Not on file  . Alcohol Use: No  . Drug Use: No  . Sexually Active: Yes   Other Topics Concern  . Not on file   Social History Narrative  . No narrative on file    Past Surgical History  Procedure Date  . Epidural block injection     dr Channing Mutters  . Lumbar laminectomy   . Lumbar fusion     Family History  Problem Relation Age of Onset  . Cancer Mother     breast/ metastatic  . Hypertension Father   . Liver disease Father   . Alcohol abuse Father     No Known Allergies  Current Outpatient Prescriptions on File Prior to Visit  Medication Sig Dispense Refill  . ALPRAZolam (XANAX XR) 2 MG 24 hr tablet Take 1 tablet (2 mg total) by mouth 2 (two) times daily.  60 tablet  5  . betamethasone (CELESTONE) 0.6 MG/5ML  solution Take 1.2 mg by mouth 2 (two) times daily.        . betamethasone, augmented, (DIPROLENE) 0.05 % lotion Apply topically daily. qpply mixture to dry skin 2 times a day--1/4 betamethasone lotion with 3/4 eucerinelotion       . butalbital-aspirin-caffeine-codeine (FIORINAL WITH CODEINE) 50-325-40-30 MG capsule Take 1 capsule by mouth every 4 (four) hours as needed.  30 capsule  3  . Cholecalciferol (VITAMIN D3) 400 UNITS CAPS Take by mouth daily.        . fenofibrate micronized (LOFIBRA) 134 MG capsule Take 134 mg by mouth daily before breakfast.        . fentaNYL (DURAGESIC - DOSED MCG/HR) 75 MCG/HR Place 1 patch (75 mcg total) onto the skin every 3 (three) days.  10 patch  0  . fentaNYL (DURAGESIC - DOSED MCG/HR) 75 MCG/HR Place 1 patch (75 mcg total) onto the skin every 3 (three) days.  10 patch  0  . fentaNYL (DURAGESIC - DOSED MCG/HR) 75 MCG/HR Place 1 patch (75 mcg total) onto the skin every 3 (three) days.  10 patch  0  . glucosamine-chondroitin (MAX GLUCOSAMINE CHONDROITIN) 500-400 MG tablet Take 1 tablet by mouth 3 (three) times daily.      . methadone (METHADOSE) 40 MG  disintegrating tablet Take 1 tablet (40 mg total) by mouth 4 (four) times daily.  120 tablet  0  . methadone (METHADOSE) 40 MG disintegrating tablet Take 1 tablet (40 mg total) by mouth 4 (four) times daily.  120 tablet  0  . omeprazole (PRILOSEC) 20 MG capsule Take 20 mg by mouth daily.        Marland Kitchen oxycodone (ROXICODONE) 30 MG immediate release tablet Take 1 tablet (30 mg total) by mouth every 4 (four) hours as needed.  180 tablet  0  . oxycodone (ROXICODONE) 30 MG immediate release tablet Take 1 tablet (30 mg total) by mouth every 4 (four) hours as needed.  180 tablet  0  . vitamin E 400 UNIT capsule Take 400 Units by mouth daily.          BP 120/80  Pulse 103  Temp 98.4 F (36.9 C) (Oral)  Wt 226 lb (102.513 kg)  SpO2 94%chart    Objective:   Physical Exam  Constitutional: He is oriented to person, place, and  time. He appears well-developed.  HENT:  Right Ear: External ear normal.  Left Ear: External ear normal.  Nose: Nose normal.  Mouth/Throat: Oropharynx is clear and moist.  Neck: Normal range of motion. Neck supple.  Cardiovascular: Normal rate, regular rhythm and normal heart sounds.   Pulmonary/Chest: Effort normal and breath sounds normal.  Neurological: He is alert and oriented to person, place, and time.  Skin: Skin is warm and dry.  Psychiatric: He has a normal mood and affect.          Assessment & Plan:  Assessment: Upper respiratory infection, Tobacco abuse  Plan: Medrol Dosepak as directed. Continue over-the-counter symptomatic treatment for relief. Patient call the office if symptoms worsen or persist. Recheck a schedule, and when necessary. Patient has   been taken over-the-counter ibuprofen and a nasal spray with no relief.

## 2012-11-19 ENCOUNTER — Other Ambulatory Visit: Payer: Self-pay | Admitting: Internal Medicine

## 2012-11-19 ENCOUNTER — Encounter: Payer: Self-pay | Admitting: Internal Medicine

## 2012-11-19 ENCOUNTER — Other Ambulatory Visit: Payer: Self-pay | Admitting: *Deleted

## 2012-11-19 DIAGNOSIS — G8929 Other chronic pain: Secondary | ICD-10-CM

## 2012-11-19 MED ORDER — OXYCODONE HCL 30 MG PO TABS: 30.0000 mg | ORAL_TABLET | ORAL | Status: DC | PRN

## 2012-11-19 MED ORDER — FENTANYL 75 MCG/HR TD PT72: 1.0000 | MEDICATED_PATCH | TRANSDERMAL | Status: DC

## 2012-11-19 MED ORDER — METHADONE HCL 40 MG PO TBSO: 40.0000 mg | ORAL_TABLET | Freq: Four times a day (QID) | ORAL | Status: DC

## 2012-11-19 NOTE — Telephone Encounter (Signed)
Pt needs new rxs oxycodone,methadone and fentanyl patches

## 2012-11-19 NOTE — Telephone Encounter (Signed)
Printed and will call pt after dr jenkins signs 

## 2012-11-19 NOTE — Progress Notes (Signed)
Subjective:    Patient ID: Adrian Ward, male    DOB: 08/07/1966, 47 y.o.   MRN: [id number]  HPI  CPX Chronic pain management for orthopedic and back pain DM risks Lipid manement  Review of Systems  Constitutional: Positive for fatigue. Negative for fever.  HENT: Positive for neck pain and neck stiffness. Negative for hearing loss, congestion and postnasal drip.   Eyes: Negative for discharge, redness and visual disturbance.  Respiratory: Negative for cough, shortness of breath and wheezing.   Cardiovascular: Negative for leg swelling.  Gastrointestinal: Negative for abdominal pain, constipation and abdominal distention.  Genitourinary: Negative for urgency and frequency.  Musculoskeletal: Positive for myalgias, back pain, joint swelling and arthralgias.  Skin: Negative for color change and rash.  Neurological: Positive for weakness. Negative for light-headedness.  Hematological: Negative for adenopathy.  Psychiatric/Behavioral: Negative for behavioral problems.   Past Medical History  Diagnosis Date  . Chronic back pain   . Cervical disc disease   . Hyperlipidemia   . Cervical arthritis   . Headache   . Hypertriglyceridemia     History   Social History  . Marital Status: Married    Spouse Name: N/A    Number of Children: N/A  . Years of Education: N/A   Occupational History  . Not on file.   Social History Main Topics  . Smoking status: Current Every Day Smoker -- 0.5 packs/day  . Smokeless tobacco: Not on file  . Alcohol Use: No  . Drug Use: No  . Sexually Active: Yes   Other Topics Concern  . Not on file   Social History Narrative  . No narrative on file    Past Surgical History  Procedure Date  . Epidural block injection     dr Channing Mutters  . Lumbar laminectomy   . Lumbar fusion     Family History  Problem Relation Age of Onset  . Cancer Mother     breast/ metastatic  . Hypertension Father   . Liver disease Father   . Alcohol abuse Father      No Known Allergies  Current Outpatient Prescriptions on File Prior to Visit  Medication Sig Dispense Refill  . ALPRAZolam (XANAX XR) 2 MG 24 hr tablet Take 1 tablet (2 mg total) by mouth 2 (two) times daily.  60 tablet  5  . betamethasone (CELESTONE) 0.6 MG/5ML solution Take 1.2 mg by mouth 2 (two) times daily.        . betamethasone, augmented, (DIPROLENE) 0.05 % lotion Apply topically daily. qpply mixture to dry skin 2 times a day--1/4 betamethasone lotion with 3/4 eucerinelotion       . butalbital-aspirin-caffeine-codeine (FIORINAL WITH CODEINE) 50-325-40-30 MG capsule Take 1 capsule by mouth every 4 (four) hours as needed.  30 capsule  3  . Cholecalciferol (VITAMIN D3) 400 UNITS CAPS Take by mouth daily.        . fenofibrate micronized (LOFIBRA) 134 MG capsule Take 134 mg by mouth daily before breakfast.        . fentaNYL (DURAGESIC - DOSED MCG/HR) 75 MCG/HR Place 1 patch (75 mcg total) onto the skin every 3 (three) days.  10 patch  0  . fentaNYL (DURAGESIC - DOSED MCG/HR) 75 MCG/HR Place 1 patch (75 mcg total) onto the skin every 3 (three) days.  10 patch  0  . fentaNYL (DURAGESIC - DOSED MCG/HR) 75 MCG/HR Place 1 patch (75 mcg total) onto the skin every 3 (three) days.  10 patch  0  .  glucosamine-chondroitin (MAX GLUCOSAMINE CHONDROITIN) 500-400 MG tablet Take 1 tablet by mouth 3 (three) times daily.      . methadone (METHADOSE) 40 MG disintegrating tablet Take 1 tablet (40 mg total) by mouth 4 (four) times daily.  120 tablet  0  . methadone (METHADOSE) 40 MG disintegrating tablet Take 1 tablet (40 mg total) by mouth 4 (four) times daily.  120 tablet  0  . omeprazole (PRILOSEC) 20 MG capsule Take 20 mg by mouth daily.        Marland Kitchen oxycodone (ROXICODONE) 30 MG immediate release tablet Take 1 tablet (30 mg total) by mouth every 4 (four) hours as needed.  180 tablet  0  . oxycodone (ROXICODONE) 30 MG immediate release tablet Take 1 tablet (30 mg total) by mouth every 4 (four) hours as needed.   180 tablet  0  . vitamin E 400 UNIT capsule Take 400 Units by mouth daily.          BP 120/82  Pulse 80  Temp 98.3 F (36.8 C)  Resp 16  Ht 5\' 7"  (1.702 m)  Wt 230 lb (104.327 kg)  BMI 36.02 kg/m2    c      Objective:   Physical Exam  Nursing note and vitals reviewed. Constitutional: He appears well-developed and well-nourished.  HENT:  Head: Normocephalic and atraumatic.  Eyes: Conjunctivae normal are normal. Pupils are equal, round, and reactive to light.  Neck: Normal range of motion. Neck supple.  Cardiovascular: Normal rate and regular rhythm.   Pulmonary/Chest: Effort normal and breath sounds normal.  Abdominal: Soft. Bowel sounds are normal. He exhibits distension.  Genitourinary:       Enlarged prostate  Musculoskeletal: He exhibits edema and tenderness.  Skin: Skin is warm and dry.          Assessment & Plan:   Patient presents for yearly preventative medicine examination.   all immunizations and health maintenance protocols were reviewed with the patient and they are up to date with these protocols.   screening laboratory values were reviewed with the patient including screening of hyperlipidemia PSA renal function and hepatic function.   There medications past medical history social history problem list and allergies were reviewed in detail.   Goals were established with regard to weight loss exercise diet in compliance with medications  reviewed lipids and need for better control weight gain increased risks for DM Use of oral tobacco discussed

## 2012-12-13 ENCOUNTER — Other Ambulatory Visit: Payer: Self-pay | Admitting: *Deleted

## 2012-12-13 MED ORDER — ALPRAZOLAM ER 2 MG PO TB24: 2.0000 mg | ORAL_TABLET | Freq: Two times a day (BID) | ORAL | Status: DC

## 2012-12-14 ENCOUNTER — Encounter: Payer: Self-pay | Admitting: Internal Medicine

## 2012-12-14 ENCOUNTER — Ambulatory Visit (INDEPENDENT_AMBULATORY_CARE_PROVIDER_SITE_OTHER): Payer: BC Managed Care – PPO | Admitting: Internal Medicine

## 2012-12-14 VITALS — BP 126/80 | HR 76 | Temp 98.1°F | Resp 16 | Ht 67.0 in | Wt 228.0 lb

## 2012-12-14 DIAGNOSIS — M778 Other enthesopathies, not elsewhere classified: Secondary | ICD-10-CM

## 2012-12-14 DIAGNOSIS — E162 Hypoglycemia, unspecified: Secondary | ICD-10-CM

## 2012-12-14 DIAGNOSIS — M65839 Other synovitis and tenosynovitis, unspecified forearm: Secondary | ICD-10-CM

## 2012-12-14 DIAGNOSIS — M65849 Other synovitis and tenosynovitis, unspecified hand: Secondary | ICD-10-CM

## 2012-12-14 DIAGNOSIS — E291 Testicular hypofunction: Secondary | ICD-10-CM

## 2012-12-14 DIAGNOSIS — K1321 Leukoplakia of oral mucosa, including tongue: Secondary | ICD-10-CM

## 2012-12-14 MED ORDER — METHYLPREDNISOLONE ACETATE 40 MG/ML IJ SUSP: 40.0000 mg | Freq: Once | INTRAMUSCULAR | Status: DC

## 2012-12-14 NOTE — Progress Notes (Signed)
  Subjective:    Patient ID: Adrian Ward, male    DOB: September 07, 1966, 47 y.o.   MRN: [id number]  HPI The patient has recurrent wrist pain on the right Prior injection helped Has a white spot and itching of tongue Has been using smokeless tobacco    Review of Systems  Constitutional: Negative for fever and fatigue.  HENT: Positive for mouth sores. Negative for hearing loss, congestion, neck pain and postnasal drip.        White plaque on tip of tongue  Eyes: Negative for discharge, redness and visual disturbance.  Respiratory: Negative for cough, shortness of breath and wheezing.   Cardiovascular: Negative for leg swelling.  Gastrointestinal: Negative for abdominal pain, constipation and abdominal distention.  Genitourinary: Negative for urgency and frequency.  Musculoskeletal: Positive for back pain, joint swelling and gait problem. Negative for arthralgias.  Skin: Negative for color change and rash.  Neurological: Negative for weakness and light-headedness.  Hematological: Negative for adenopathy.  Psychiatric/Behavioral: Negative for behavioral problems.       Objective:   Physical Exam  Nursing note and vitals reviewed. Constitutional: He appears well-developed.  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 Pupils are equal, round, and reactive to light.  Neck: Normal range of motion. Neck supple.  Cardiovascular: Normal rate and regular rhythm.   Pulmonary/Chest: Effort normal and breath sounds normal.  Musculoskeletal: He exhibits tenderness.          Assessment & Plan:   Injection of right wrist for tenosynovitis  Informed consent obtained and the patient's right wrist was prepped with betadine. Local anesthesia was obtained with topical spray. Then 40 mg of Depo-Medrol and 1/2 cc of lidocaine was injected into the joint space. The patient tolerated the procedure without complications. Post injection care discussed with  patient. Referral to ENT Documentation of allergy to pertussis vaccine

## 2012-12-14 NOTE — Patient Instructions (Signed)
The patient is instructed to continue all medications as prescribed. Schedule followup with check out clerk upon leaving the clinic  

## 2012-12-17 LAB — TESTOSTERONE, FREE, TOTAL, SHBG
Testosterone, Free: 17.7 pg/mL — ABNORMAL LOW (ref 47.0–244.0)
Testosterone-% Free: 2.1 % (ref 1.6–2.9)
Testosterone: 85 ng/dL — ABNORMAL LOW (ref 300–890)

## 2013-01-24 ENCOUNTER — Other Ambulatory Visit: Payer: Self-pay | Admitting: *Deleted

## 2013-01-24 MED ORDER — BUTALBITAL-ASA-CAFF-CODEINE 50-325-40-30 MG PO CAPS: 1.0000 | ORAL_CAPSULE | ORAL | Status: DC | PRN

## 2013-02-18 ENCOUNTER — Telehealth: Payer: Self-pay | Admitting: Internal Medicine

## 2013-02-18 DIAGNOSIS — G8929 Other chronic pain: Secondary | ICD-10-CM

## 2013-02-18 MED ORDER — OXYCODONE HCL 30 MG PO TABS
30.0000 mg | ORAL_TABLET | ORAL | Status: DC | PRN
Start: 2013-02-18 — End: 2013-02-18

## 2013-02-18 MED ORDER — FENTANYL 75 MCG/HR TD PT72: 1.0000 | MEDICATED_PATCH | TRANSDERMAL | Status: DC

## 2013-02-18 MED ORDER — METHADONE HCL 40 MG PO TBSO: 40.0000 mg | ORAL_TABLET | Freq: Four times a day (QID) | ORAL | Status: DC

## 2013-02-18 MED ORDER — OXYCODONE HCL 30 MG PO TABS: 30.0000 mg | ORAL_TABLET | ORAL | Status: DC | PRN

## 2013-02-18 NOTE — Telephone Encounter (Signed)
Printed and will call pt to pick up when ready

## 2013-02-18 NOTE — Telephone Encounter (Signed)
Patient called stating that he need a refill of his Fentanyl patch, methadose 40 mg 1po 4 times qd, oxycodone 30 mg 1po every 4 hrs prn for pain. Please assist.

## 2013-05-14 ENCOUNTER — Telehealth: Payer: Self-pay | Admitting: Internal Medicine

## 2013-05-14 DIAGNOSIS — G8929 Other chronic pain: Secondary | ICD-10-CM

## 2013-05-14 MED ORDER — OXYCODONE HCL 30 MG PO TABS: 30.0000 mg | ORAL_TABLET | ORAL | Status: DC | PRN

## 2013-05-14 MED ORDER — FENTANYL 75 MCG/HR TD PT72: 1.0000 | MEDICATED_PATCH | TRANSDERMAL | Status: DC

## 2013-05-14 MED ORDER — METHADONE HCL 40 MG PO TBSO: 40.0000 mg | ORAL_TABLET | Freq: Four times a day (QID) | ORAL | Status: DC

## 2013-05-14 NOTE — Telephone Encounter (Signed)
Printed and will call pt to pick up after dr Lovell Sheehan signs on tuesday

## 2013-05-14 NOTE — Telephone Encounter (Signed)
Pt needs new rxs on methadone,oxycodone and fentanyl patches

## 2013-06-25 ENCOUNTER — Other Ambulatory Visit: Payer: Self-pay | Admitting: *Deleted

## 2013-06-25 MED ORDER — ALPRAZOLAM ER 2 MG PO TB24: 2.0000 mg | ORAL_TABLET | Freq: Two times a day (BID) | ORAL | Status: DC

## 2013-08-16 ENCOUNTER — Telehealth: Payer: Self-pay | Admitting: Internal Medicine

## 2013-08-16 NOTE — Telephone Encounter (Signed)
Pt is calling to request a refill of his oxycodone (ROXICODONE) 30 MG immediate release tablet, methadone (METHADOSE) 40 MG disintegrating tablet, and fentaNYL (DURAGESIC - DOSED MCG/HR) 75 MCG/HR. Please assist.

## 2013-08-19 ENCOUNTER — Other Ambulatory Visit: Payer: Self-pay | Admitting: *Deleted

## 2013-08-19 DIAGNOSIS — G8929 Other chronic pain: Secondary | ICD-10-CM

## 2013-08-19 MED ORDER — OXYCODONE HCL 30 MG PO TABS: 30.0000 mg | ORAL_TABLET | ORAL | Status: DC | PRN

## 2013-08-19 MED ORDER — METHADONE HCL 40 MG PO TBSO: 40.0000 mg | ORAL_TABLET | Freq: Four times a day (QID) | ORAL | Status: DC

## 2013-08-19 MED ORDER — FENTANYL 75 MCG/HR TD PT72: 1.0000 | MEDICATED_PATCH | TRANSDERMAL | Status: DC

## 2013-08-19 NOTE — Telephone Encounter (Signed)
Printed and will call pt when ready for pick up 

## 2013-10-28 ENCOUNTER — Other Ambulatory Visit: Payer: Self-pay | Admitting: Internal Medicine

## 2013-10-28 MED ORDER — ALPRAZOLAM ER 2 MG PO TB24: 2.0000 mg | ORAL_TABLET | Freq: Two times a day (BID) | ORAL | Status: DC

## 2013-10-28 NOTE — Telephone Encounter (Signed)
done

## 2013-11-18 ENCOUNTER — Telehealth: Payer: Self-pay | Admitting: Internal Medicine

## 2013-11-18 ENCOUNTER — Other Ambulatory Visit: Payer: Self-pay | Admitting: *Deleted

## 2013-11-18 DIAGNOSIS — M549 Dorsalgia, unspecified: Principal | ICD-10-CM

## 2013-11-18 DIAGNOSIS — G8929 Other chronic pain: Secondary | ICD-10-CM

## 2013-11-18 MED ORDER — OXYCODONE HCL 30 MG PO TABS: 30.0000 mg | ORAL_TABLET | ORAL | Status: DC | PRN

## 2013-11-18 MED ORDER — FENTANYL 75 MCG/HR TD PT72: 75.0000 ug | MEDICATED_PATCH | TRANSDERMAL | Status: DC

## 2013-11-18 MED ORDER — METHADONE HCL 40 MG PO TBSO: 40.0000 mg | ORAL_TABLET | Freq: Four times a day (QID) | ORAL | Status: DC

## 2013-11-18 MED ORDER — METHADONE HCL 40 MG PO TBSO
40.0000 mg | ORAL_TABLET | Freq: Four times a day (QID) | ORAL | Status: DC
Start: 2013-11-18 — End: 2013-11-18

## 2013-11-18 NOTE — Telephone Encounter (Signed)
Printed and will call pt to pick up after dr jenkins signs 

## 2013-11-18 NOTE — Telephone Encounter (Signed)
Pt requesting refill of the following:  fentaNYL (DURAGESIC - DOSED MCG/HR) 75 MCG/HR methadone (METHADOSE) 40 MG disintegrating tablet oxycodone (ROXICODONE) 30 MG immediate release tablet

## 2014-02-17 ENCOUNTER — Telehealth: Payer: Self-pay | Admitting: Internal Medicine

## 2014-02-17 NOTE — Telephone Encounter (Signed)
Pt requesting re-fill on  The following oxycodone (ROXICODONE) 30 MG immediate release tablet methadone (METHADOSE) 40 MG disintegrating tablet fentaNYL (DURAGESIC - DOSED MCG/HR) 75 MCG/HR

## 2014-02-18 NOTE — Telephone Encounter (Signed)
Pt has appt sch for 04-25-14. Pt would like to have refill on each medications in meantime

## 2014-02-18 NOTE — Telephone Encounter (Signed)
Will have to ask dr. Lovell SheehanJenkins when he is in the office if he will approve refill

## 2014-02-18 NOTE — Telephone Encounter (Signed)
Spoke with pt and informed him that he would not be able to receive refills because he hasn't been seen in clinic since 12-20-2013 and that he would need to schedule an appt, in which he stated that he would get done

## 2014-02-21 ENCOUNTER — Telehealth: Payer: Self-pay | Admitting: Internal Medicine

## 2014-02-21 NOTE — Telephone Encounter (Signed)
Pt is calling wanting update on his refill

## 2014-02-24 ENCOUNTER — Other Ambulatory Visit: Payer: Self-pay

## 2014-02-24 DIAGNOSIS — G8929 Other chronic pain: Secondary | ICD-10-CM

## 2014-02-24 DIAGNOSIS — M549 Dorsalgia, unspecified: Principal | ICD-10-CM

## 2014-02-24 MED ORDER — METHADONE HCL 40 MG PO TBSO: 40.0000 mg | ORAL_TABLET | Freq: Four times a day (QID) | ORAL | Status: DC

## 2014-02-24 MED ORDER — FENTANYL 75 MCG/HR TD PT72: 75.0000 ug | MEDICATED_PATCH | TRANSDERMAL | Status: DC

## 2014-02-24 MED ORDER — OXYCODONE HCL 30 MG PO TABS: 30.0000 mg | ORAL_TABLET | ORAL | Status: DC | PRN

## 2014-02-24 NOTE — Telephone Encounter (Signed)
rx for fentanyl, oxycodone and methadone, printed and signed by dr. Lovell SheehanJenkins, pt picked up rx's from front desk and signed them out

## 2014-02-25 NOTE — Telephone Encounter (Signed)
Error/gd °

## 2014-03-28 ENCOUNTER — Telehealth: Payer: Self-pay | Admitting: Internal Medicine

## 2014-03-28 DIAGNOSIS — G8929 Other chronic pain: Secondary | ICD-10-CM

## 2014-03-28 DIAGNOSIS — M549 Dorsalgia, unspecified: Principal | ICD-10-CM

## 2014-03-28 NOTE — Telephone Encounter (Signed)
Pt needs new rxs for methadone,fentanyl patch and oxycodone

## 2014-04-02 MED ORDER — FENTANYL 75 MCG/HR TD PT72: 75.0000 ug | MEDICATED_PATCH | TRANSDERMAL | Status: DC

## 2014-04-02 MED ORDER — METHADONE HCL 40 MG PO TBSO: 40.0000 mg | ORAL_TABLET | Freq: Four times a day (QID) | ORAL | Status: DC

## 2014-04-02 MED ORDER — OXYCODONE HCL 30 MG PO TABS: 30.0000 mg | ORAL_TABLET | ORAL | Status: DC | PRN

## 2014-04-02 NOTE — Telephone Encounter (Signed)
Ok per Dr Swords, rx up front for p/u, pt aware 

## 2014-04-02 NOTE — Telephone Encounter (Signed)
Pt following up on request for meds. Pt concerned about future refills on these meds. Pt will be out of med on Friday. pls advise

## 2014-04-04 ENCOUNTER — Telehealth: Payer: Self-pay | Admitting: Internal Medicine

## 2014-04-04 NOTE — Telephone Encounter (Signed)
Pt received letter and is aware he will need to find a new provider.  However, he is concerned if medications he is currently taking will be managed by new provider.  Requesting recommendation on who would be the ideal physician for him to transfer care to so that his treatment plan remains the same.

## 2014-04-08 NOTE — Telephone Encounter (Signed)
Per Dr Lovell Sheehan he recommends Dr Kirtland Bouchard.  Will you accept pt?

## 2014-04-08 NOTE — Telephone Encounter (Signed)
NO- not available

## 2014-04-11 NOTE — Telephone Encounter (Signed)
Will need to wait for Dr Durene Cal but i will refill until then

## 2014-04-11 NOTE — Telephone Encounter (Signed)
Left message for pt to call back  °

## 2014-04-15 NOTE — Telephone Encounter (Signed)
Pt 's sister aware.

## 2014-04-25 ENCOUNTER — Encounter: Payer: Self-pay | Admitting: Internal Medicine

## 2014-04-25 ENCOUNTER — Ambulatory Visit: Payer: BC Managed Care – PPO | Admitting: Internal Medicine

## 2014-04-25 ENCOUNTER — Ambulatory Visit (INDEPENDENT_AMBULATORY_CARE_PROVIDER_SITE_OTHER): Payer: BC Managed Care – PPO | Admitting: Internal Medicine

## 2014-04-25 VITALS — BP 128/92 | HR 88 | Temp 97.9°F | Ht 67.0 in | Wt 233.0 lb

## 2014-04-25 DIAGNOSIS — M549 Dorsalgia, unspecified: Secondary | ICD-10-CM

## 2014-04-25 DIAGNOSIS — G8929 Other chronic pain: Secondary | ICD-10-CM

## 2014-04-25 DIAGNOSIS — G894 Chronic pain syndrome: Secondary | ICD-10-CM

## 2014-04-25 MED ORDER — METHADONE HCL 40 MG PO TBSO: 40.0000 mg | ORAL_TABLET | Freq: Four times a day (QID) | ORAL | Status: DC

## 2014-04-25 MED ORDER — BUTALBITAL-ASA-CAFF-CODEINE 50-325-40-30 MG PO CAPS: 1.0000 | ORAL_CAPSULE | ORAL | Status: DC | PRN

## 2014-04-25 MED ORDER — OXYCODONE HCL 30 MG PO TABS: 30.0000 mg | ORAL_TABLET | ORAL | Status: DC | PRN

## 2014-04-25 MED ORDER — FENTANYL 75 MCG/HR TD PT72: 75.0000 ug | MEDICATED_PATCH | TRANSDERMAL | Status: DC

## 2014-04-25 NOTE — Patient Instructions (Signed)
The patient is instructed to continue all medications as prescribed. Schedule followup with check out clerk upon leaving the clinic  

## 2014-04-25 NOTE — Progress Notes (Signed)
Subjective:    Patient ID: Adrian HippsJoseph T Ikeda, male    DOB: Mar 01, 1966, 48 y.o.   MRN: [id number]  HPI  Medication management for chronic pain Has been very stable and had kept his pain contract  Has never lost or asked for early refills   Review of Systems  Constitutional: Negative for fever and fatigue.  HENT: Negative for congestion, hearing loss and postnasal drip.   Eyes: Negative for discharge, redness and visual disturbance.  Respiratory: Negative for cough, shortness of breath and wheezing.   Cardiovascular: Negative for leg swelling.  Gastrointestinal: Negative for abdominal pain, constipation and abdominal distention.  Genitourinary: Negative for urgency and frequency.  Musculoskeletal: Positive for back pain, gait problem, joint swelling, myalgias and neck pain. Negative for arthralgias.  Skin: Negative for color change and rash.  Neurological: Negative for weakness and light-headedness.  Hematological: Negative for adenopathy.  Psychiatric/Behavioral: Negative for behavioral problems.       Past Medical History  Diagnosis Date  . Chronic back pain   . Cervical disc disease   . Hyperlipidemia   . Cervical arthritis   . Headache(784.0)   . Hypertriglyceridemia     History   Social History  . Marital Status: Married    Spouse Name: N/A    Number of Children: N/A  . Years of Education: N/A   Occupational History  . Not on file.   Social History Main Topics  . Smoking status: Current Every Day Smoker -- 0.50 packs/day  . Smokeless tobacco: Not on file  . Alcohol Use: No  . Drug Use: No  . Sexual Activity: Yes   Other Topics Concern  . Not on file   Social History Narrative  . No narrative on file    Past Surgical History  Procedure Laterality Date  . Epidural block injection      dr Channing Muttersroy  . Lumbar laminectomy    . Lumbar fusion      Family History  Problem Relation Age of Onset  . Cancer Mother     breast/ metastatic  . Hypertension  Father   . Liver disease Father   . Alcohol abuse Father     Allergies  Allergen Reactions  . Pertussis Vaccines     Had an allergic reaction to DPT vaccine but never to DT    Current Outpatient Prescriptions on File Prior to Visit  Medication Sig Dispense Refill  . alprazolam (XANAX) 2 MG tablet TAKE 1 TABLET BY MOUTH TWICE DAILY  60 tablet  2  . betamethasone, augmented, (DIPROLENE) 0.05 % lotion Apply topically daily. qpply mixture to dry skin 2 times a day--1/4 betamethasone lotion with 3/4 eucerinelotion       . butalbital-aspirin-caffeine-codeine (FIORINAL WITH CODEINE) 50-325-40-30 MG capsule Take 1 capsule by mouth every 4 (four) hours as needed.  30 capsule  2  . Cholecalciferol (VITAMIN D3) 400 UNITS CAPS Take by mouth daily.        . fentaNYL (DURAGESIC - DOSED MCG/HR) 75 MCG/HR Place 1 patch (75 mcg total) onto the skin every 3 (three) days.  10 patch  0  . methadone (METHADOSE) 40 MG disintegrating tablet Take 1 tablet (40 mg total) by mouth 4 (four) times daily.  120 tablet  0  . oxycodone (ROXICODONE) 30 MG immediate release tablet Take 1 tablet (30 mg total) by mouth every 4 (four) hours as needed.  180 tablet  0  . vitamin E 400 UNIT capsule Take 400 Units by mouth daily.  No current facility-administered medications on file prior to visit.    BP 128/92  Pulse 88  Temp(Src) 97.9 F (36.6 C) (Oral)  Ht 5\' 7"  (1.702 m)  Wt 233 lb (105.688 kg)  BMI 36.48 kg/m2    Objective:   Physical Exam  Constitutional: He appears well-developed and well-nourished.  HENT:  Head: Normocephalic and atraumatic.  Eyes: Conjunctivae are normal. Pupils are equal, round, and reactive to light.  Neck: Normal range of motion. Neck supple.  Cardiovascular: Normal rate and regular rhythm.   Pulmonary/Chest: Effort normal and breath sounds normal.  Abdominal: Soft. Bowel sounds are normal.          Assessment & Plan:  Chronic stable pain management Adherent and  stable

## 2014-04-25 NOTE — Progress Notes (Signed)
Pre visit review using our clinic review tool, if applicable. No additional management support is needed unless otherwise documented below in the visit note. 

## 2014-07-21 ENCOUNTER — Telehealth: Payer: Self-pay | Admitting: Neurosurgery

## 2014-07-21 NOTE — Telephone Encounter (Signed)
Ok with me, but please be aware that I do no longer practice chronic pain medicine, and I do not prescribe schedule II or higher medications for chronic pain, thanks

## 2014-07-21 NOTE — Telephone Encounter (Signed)
This is a [facility] patient requesting Dr. Jonny Ruiz to be his PCP

## 2014-07-22 NOTE — Telephone Encounter (Signed)
Scheduled pt for Sept 29th.  He is ok to be referred to pain management.

## 2014-08-05 ENCOUNTER — Ambulatory Visit: Payer: BC Managed Care – PPO | Admitting: Internal Medicine

## 2014-08-07 ENCOUNTER — Encounter: Payer: Self-pay | Admitting: Internal Medicine

## 2014-08-07 ENCOUNTER — Ambulatory Visit (INDEPENDENT_AMBULATORY_CARE_PROVIDER_SITE_OTHER): Payer: BC Managed Care – PPO | Admitting: Internal Medicine

## 2014-08-07 VITALS — BP 102/78 | HR 98 | Temp 97.9°F | Ht 67.0 in | Wt 236.4 lb

## 2014-08-07 DIAGNOSIS — M545 Low back pain: Secondary | ICD-10-CM

## 2014-08-07 DIAGNOSIS — R7302 Impaired glucose tolerance (oral): Secondary | ICD-10-CM

## 2014-08-07 DIAGNOSIS — E291 Testicular hypofunction: Secondary | ICD-10-CM

## 2014-08-07 DIAGNOSIS — Z0000 Encounter for general adult medical examination without abnormal findings: Secondary | ICD-10-CM

## 2014-08-07 DIAGNOSIS — M549 Dorsalgia, unspecified: Secondary | ICD-10-CM

## 2014-08-07 DIAGNOSIS — G8929 Other chronic pain: Secondary | ICD-10-CM

## 2014-08-07 DIAGNOSIS — Z23 Encounter for immunization: Secondary | ICD-10-CM

## 2014-08-07 HISTORY — DX: Testicular hypofunction: E29.1

## 2014-08-07 MED ORDER — OXYCODONE HCL 30 MG PO TABS: 30.0000 mg | ORAL_TABLET | ORAL | Status: DC | PRN

## 2014-08-07 MED ORDER — METHADONE HCL 40 MG PO TBSO: 40.0000 mg | ORAL_TABLET | Freq: Four times a day (QID) | ORAL | Status: DC

## 2014-08-07 NOTE — Progress Notes (Signed)
Subjective:    Patient ID: Adrian Ward, male    DOB: 05/02/1966, 48 y.o.   MRN: [id number]  HPI  Here to fu, in transfer from Dr Lovell Sheehan now left the clinical practice; Here for wellness and f/u;  Overall doing ok;  Pt denies CP, worsening SOB, DOE, wheezing, orthopnea, PND, worsening LE edema, palpitations, dizziness or syncope.  Pt denies neurological change such as new headache, facial or extremity weakness.  Pt denies polydipsia, polyuria, or low sugar symptoms. Pt states overall good compliance with treatment and medications, good tolerability, and has been trying to follow lower cholesterol diet.  Pt denies worsening depressive symptoms, suicidal ideation or panic. No fever, night sweats, wt loss, loss of appetite, or other constitutional symptoms.  Pt states good ability with ADL's, has low fall risk, home safety reviewed and adequate, no other significant changes in hearing or vision, and only occasionally active with exercise. Overall pain ok, needs pain clinic referral - Pt continues to have recurring LBP without change in severity, bowel or bladder change, fever, wt loss,  worsening LE pain/numbness/weakness, gait change or falls.  Past Medical History  Diagnosis Date  . Chronic back pain   . Cervical disc disease   . Hyperlipidemia   . Cervical arthritis   . Headache(784.0)   . Hypertriglyceridemia    Past Surgical History  Procedure Laterality Date  . Epidural block injection      dr Channing Mutters  . Lumbar laminectomy    . Lumbar fusion      reports that he has been smoking.  He does not have any smokeless tobacco history on file. He reports that he does not drink alcohol or use illicit drugs. family history includes Alcohol abuse in his father; Cancer in his mother; Hypertension in his father; Liver disease in his father. Allergies  Allergen Reactions  . Pertussis Vaccines     Had an allergic reaction to DPT vaccine but never to DT   Current Outpatient Prescriptions on File  Prior to Visit  Medication Sig Dispense Refill  . alprazolam (XANAX) 2 MG tablet TAKE 1 TABLET BY MOUTH TWICE DAILY  60 tablet  2  . betamethasone, augmented, (DIPROLENE) 0.05 % lotion Apply topically daily. qpply mixture to dry skin 2 times a day--1/4 betamethasone lotion with 3/4 eucerinelotion       . butalbital-aspirin-caffeine-codeine (FIORINAL WITH CODEINE) 50-325-40-30 MG capsule Take 1 capsule by mouth every 4 (four) hours as needed.  30 capsule  2  . Cholecalciferol (VITAMIN D3) 400 UNITS CAPS Take by mouth daily.        . fentaNYL (DURAGESIC - DOSED MCG/HR) 75 MCG/HR Place 1 patch (75 mcg total) onto the skin every 3 (three) days.  10 patch  0  . methadone (METHADOSE) 40 MG disintegrating tablet Take 1 tablet (40 mg total) by mouth 4 (four) times daily.  120 tablet  0  . oxycodone (ROXICODONE) 30 MG immediate release tablet Take 1 tablet (30 mg total) by mouth every 4 (four) hours as needed.  180 tablet  0  . ranitidine (ZANTAC) 150 MG capsule Take 150 mg by mouth 2 (two) times daily.      . vitamin E 400 UNIT capsule Take 400 Units by mouth daily.         No current facility-administered medications on file prior to visit.   Review of Systems Constitutional: Negative for increased diaphoresis, other activity, appetite or other siginficant weight change  HENT: Negative for worsening hearing loss,  ear pain, facial swelling, mouth sores and neck stiffness.   Eyes: Negative for other worsening pain, redness or visual disturbance.  Respiratory: Negative for shortness of breath and wheezing.   Cardiovascular: Negative for chest pain and palpitations.  Gastrointestinal: Negative for diarrhea, blood in stool, abdominal distention or other pain Genitourinary: Negative for hematuria, flank pain or change in urine volume.  Musculoskeletal: Negative for myalgias or other joint complaints.  Skin: Negative for color change and wound.  Neurological: Negative for syncope and numbness. other than  noted Hematological: Negative for adenopathy. or other swelling Psychiatric/Behavioral: Negative for hallucinations, self-injury, decreased concentration or other worsening agitation.      Objective:   Physical Exam BP 102/78  Pulse 98  Temp(Src) 97.9 F (36.6 C) (Oral)  Ht 5\' 7"  (1.702 m)  Wt 236 lb 6 oz (107.219 kg)  BMI 37.01 kg/m2  SpO2 98% VS noted, walks stiffly with back pain, but able to ascend exam table without assist Constitutional: Pt is oriented to person, place, and time. Appears well-developed and well-nourished.  Head: Normocephalic and atraumatic.  Right Ear: External ear normal.  Left Ear: External ear normal.  Nose: Nose normal.  Mouth/Throat: Oropharynx is clear and moist.  Eyes: Conjunctivae and EOM are normal. Pupils are equal, round, and reactive to light.  Neck: Normal range of motion. Neck supple. No JVD present. No tracheal deviation present.  Cardiovascular: Normal rate, regular rhythm, normal heart sounds and intact distal pulses.   Pulmonary/Chest: Effort normal and breath sounds without rales or wheezing  Abdominal: Soft. Bowel sounds are normal. NT. No HSM  Musculoskeletal: Normal range of motion. Exhibits no edema.  Lymphadenopathy:  Has no cervical adenopathy.  Neurological: Pt is alert and oriented to person, place, and time. Pt has normal reflexes. No cranial nerve deficit. Motor grossly intact Skin: Skin is warm and dry. No rash noted.  Psychiatric:  Has normal mood and affect. Behavior is normal.     Assessment & Plan:

## 2014-08-07 NOTE — Patient Instructions (Signed)
You had the flu shot today  Please continue all other medications as before, and refills have been done if requested - the pain medications  Please have the pharmacy call with any other refills you may need.  Please continue your efforts at being more active, low cholesterol diet, and weight control.  You are otherwise up to date with prevention measures today.  Please keep your appointments with your specialists as you may have planned  Please go to the LAB in the Basement (turn left off the elevator) for the tests to be done tomorrow or at your convenience  You will be contacted regarding the referral for: pain clinic, and for colonoscopy   You will be contacted by phone if any changes need to be made immediately.  Otherwise, you will receive a letter about your results with an explanation, but please check with MyChart first.  Please remember to sign up for MyChart if you have not done so, as this will be important to you in the future with finding out test results, communicating by private email, and scheduling acute appointments online when needed.  Please return in 1 year for your yearly visit, or sooner if needed, with Lab testing done 3-5 days before

## 2014-08-07 NOTE — Progress Notes (Signed)
Pre visit review using our clinic review tool, if applicable. No additional management support is needed unless otherwise documented below in the visit note. 

## 2014-08-08 ENCOUNTER — Telehealth: Payer: Self-pay | Admitting: Internal Medicine

## 2014-08-08 ENCOUNTER — Other Ambulatory Visit (INDEPENDENT_AMBULATORY_CARE_PROVIDER_SITE_OTHER): Payer: BC Managed Care – PPO

## 2014-08-08 DIAGNOSIS — Z0189 Encounter for other specified special examinations: Secondary | ICD-10-CM

## 2014-08-08 DIAGNOSIS — R7302 Impaired glucose tolerance (oral): Secondary | ICD-10-CM

## 2014-08-08 DIAGNOSIS — Z0000 Encounter for general adult medical examination without abnormal findings: Secondary | ICD-10-CM

## 2014-08-08 DIAGNOSIS — E785 Hyperlipidemia, unspecified: Secondary | ICD-10-CM

## 2014-08-08 DIAGNOSIS — E291 Testicular hypofunction: Secondary | ICD-10-CM

## 2014-08-08 LAB — URINALYSIS, ROUTINE W REFLEX MICROSCOPIC
Bilirubin Urine: NEGATIVE
Ketones, ur: NEGATIVE
Leukocytes, UA: NEGATIVE
Nitrite: NEGATIVE
SPECIFIC GRAVITY, URINE: 1.01 (ref 1.000–1.030)
Total Protein, Urine: NEGATIVE
Urine Glucose: NEGATIVE
Urobilinogen, UA: 1 (ref 0.0–1.0)
pH: 7.5 (ref 5.0–8.0)

## 2014-08-08 LAB — CBC WITH DIFFERENTIAL/PLATELET
BASOS ABS: 0 10*3/uL (ref 0.0–0.1)
Basophils Relative: 0.4 % (ref 0.0–3.0)
Eosinophils Absolute: 0.2 10*3/uL (ref 0.0–0.7)
Eosinophils Relative: 2.4 % (ref 0.0–5.0)
HCT: 38.1 % — ABNORMAL LOW (ref 39.0–52.0)
HEMOGLOBIN: 12.8 g/dL — AB (ref 13.0–17.0)
Lymphocytes Relative: 20.6 % (ref 12.0–46.0)
Lymphs Abs: 1.4 10*3/uL (ref 0.7–4.0)
MCHC: 33.5 g/dL (ref 30.0–36.0)
MCV: 87.4 fl (ref 78.0–100.0)
Monocytes Absolute: 0.7 10*3/uL (ref 0.1–1.0)
Monocytes Relative: 10.6 % (ref 3.0–12.0)
Neutro Abs: 4.6 10*3/uL (ref 1.4–7.7)
Neutrophils Relative %: 66 % (ref 43.0–77.0)
Platelets: 199 10*3/uL (ref 150.0–400.0)
RBC: 4.36 Mil/uL (ref 4.22–5.81)
RDW: 14 % (ref 11.5–15.5)
WBC: 6.9 10*3/uL (ref 4.0–10.5)

## 2014-08-08 LAB — BASIC METABOLIC PANEL
BUN: 14 mg/dL (ref 6–23)
CALCIUM: 8.9 mg/dL (ref 8.4–10.5)
CO2: 26 meq/L (ref 19–32)
Chloride: 100 mEq/L (ref 96–112)
Creatinine, Ser: 0.9 mg/dL (ref 0.4–1.5)
GFR: 98.6 mL/min (ref >60.00)
GLUCOSE: 130 mg/dL — AB (ref 70–99)
Potassium: 4.3 mEq/L (ref 3.5–5.1)
SODIUM: 137 meq/L (ref 135–145)

## 2014-08-08 LAB — LIPID PANEL
CHOLESTEROL: 177 mg/dL (ref 0–200)
HDL: 21.3 mg/dL — AB (ref >39.00)
NonHDL: 155.7
Total CHOL/HDL Ratio: 8
Triglycerides: 801 mg/dL — ABNORMAL HIGH (ref 0.0–149.0)
VLDL: 160.2 mg/dL — ABNORMAL HIGH (ref 0.0–40.0)

## 2014-08-08 LAB — HEPATIC FUNCTION PANEL
ALK PHOS: 87 U/L (ref 39–117)
ALT: 24 U/L (ref 0–53)
AST: 23 U/L (ref 0–37)
Albumin: 3.8 g/dL (ref 3.5–5.2)
Bilirubin, Direct: 0 mg/dL (ref 0.0–0.3)
TOTAL PROTEIN: 6.9 g/dL (ref 6.0–8.3)
Total Bilirubin: 0.4 mg/dL (ref 0.2–1.2)

## 2014-08-08 LAB — HEMOGLOBIN A1C: Hgb A1c MFr Bld: 6.4 % (ref 4.6–6.5)

## 2014-08-08 LAB — LDL CHOLESTEROL, DIRECT: LDL DIRECT: 58.6 mg/dL

## 2014-08-08 LAB — TESTOSTERONE: TESTOSTERONE: 46.81 ng/dL — AB (ref 300.00–890.00)

## 2014-08-08 LAB — TSH: TSH: 1.76 u[IU]/mL (ref 0.35–4.50)

## 2014-08-08 LAB — PSA: PSA: 0.11 ng/mL (ref 0.10–4.00)

## 2014-08-08 NOTE — Telephone Encounter (Signed)
emmi mailed  °

## 2014-08-11 NOTE — Assessment & Plan Note (Signed)

## 2014-08-11 NOTE — Assessment & Plan Note (Signed)
For bridge meds, refer pain clinic

## 2014-08-11 NOTE — Assessment & Plan Note (Signed)
For f/u testosterone, LH

## 2014-08-12 ENCOUNTER — Other Ambulatory Visit: Payer: Self-pay | Admitting: Internal Medicine

## 2014-08-12 ENCOUNTER — Encounter: Payer: Self-pay | Admitting: Internal Medicine

## 2014-08-12 MED ORDER — FENOFIBRATE 145 MG PO TABS: 145.0000 mg | ORAL_TABLET | Freq: Every day | ORAL | Status: DC

## 2014-08-13 ENCOUNTER — Telehealth: Payer: Self-pay | Admitting: Internal Medicine

## 2014-08-13 MED ORDER — TESTOSTERONE CYPIONATE 200 MG/ML IM SOLN: 200.0000 mg | INTRAMUSCULAR | Status: DC

## 2014-08-13 MED ORDER — "SYRINGE/NEEDLE (DISP) 25G X 1"" 3 ML MISC": Status: DC

## 2014-08-13 NOTE — Telephone Encounter (Signed)
[facility]  The patient stated he can have his sister administer the shots as she is a Engineer, civil (consulting)nurse.  Sent in syringes/needles for the patient.

## 2014-08-13 NOTE — Telephone Encounter (Signed)
Message copied by Corwin LevinsJOHN, Jamella Grayer W on Wed Aug 13, 2014 12:19 PM ------      Message from: Scharlene GlossEWING, [name]      Created: Wed Aug 13, 2014 10:29 AM       Called the patient informed of results and instructions.  The patient does agree to start fenofibrate and would like to try the testosterone injections, mostly due to cost.  [facility]  Let me know how often to get injections and can call him back to schedule first nurse visit. ------

## 2014-08-13 NOTE — Telephone Encounter (Signed)
testost rx  - Done hardcopy to robin  Pt can give at home if ok with being instructed here with first shot per CMA

## 2014-08-13 NOTE — Addendum Note (Signed)
Addended by: Scharlene GlossEWING, [name] on: 08/13/2014 12:53 PM   Modules accepted: Orders

## 2014-08-14 ENCOUNTER — Telehealth: Payer: Self-pay | Admitting: Internal Medicine

## 2014-08-14 ENCOUNTER — Encounter: Payer: Self-pay | Admitting: Internal Medicine

## 2014-08-14 NOTE — Telephone Encounter (Signed)
Patient hasr appt doesn't  remember what he need to has to have done, please advise

## 2014-08-14 NOTE — Telephone Encounter (Signed)
Called the patient back and he had received a phone call from GI this morning and needed there number to schedule his appointment.

## 2014-10-06 ENCOUNTER — Ambulatory Visit (AMBULATORY_SURGERY_CENTER): Payer: Self-pay | Admitting: *Deleted

## 2014-10-06 VITALS — Ht 67.0 in | Wt 249.4 lb

## 2014-10-06 DIAGNOSIS — Z1211 Encounter for screening for malignant neoplasm of colon: Secondary | ICD-10-CM

## 2014-10-06 MED ORDER — MOVIPREP 100 G PO SOLR: 1.0000 | Freq: Once | ORAL | Status: DC

## 2014-10-06 NOTE — Progress Notes (Signed)
No egg or soy allergy. ewm No issues with past sedation. ewm No home 02. ewm No diet pills. ewm Pt declined emmi video. ewm

## 2014-10-09 ENCOUNTER — Ambulatory Visit (INDEPENDENT_AMBULATORY_CARE_PROVIDER_SITE_OTHER): Payer: BC Managed Care – PPO | Admitting: Internal Medicine

## 2014-10-09 ENCOUNTER — Encounter: Payer: Self-pay | Admitting: Internal Medicine

## 2014-10-09 VITALS — BP 122/62 | HR 98 | Temp 98.6°F | Ht 67.0 in | Wt 245.0 lb

## 2014-10-09 DIAGNOSIS — M549 Dorsalgia, unspecified: Secondary | ICD-10-CM

## 2014-10-09 DIAGNOSIS — G8929 Other chronic pain: Secondary | ICD-10-CM

## 2014-10-09 DIAGNOSIS — J018 Other acute sinusitis: Secondary | ICD-10-CM

## 2014-10-09 DIAGNOSIS — J019 Acute sinusitis, unspecified: Secondary | ICD-10-CM | POA: Insufficient documentation

## 2014-10-09 MED ORDER — LEVOFLOXACIN 500 MG PO TABS: 500.0000 mg | ORAL_TABLET | Freq: Every day | ORAL | Status: DC

## 2014-10-09 MED ORDER — ALPRAZOLAM 2 MG PO TABS: 2.0000 mg | ORAL_TABLET | Freq: Every day | ORAL | Status: DC | PRN

## 2014-10-09 NOTE — Progress Notes (Signed)
Subjective:    Patient ID: Adrian Ward, male    DOB: 11/28/1965, 48 y.o.   MRN: [id number]  HPI   Here with 2-3 days acute onset fever, facial pain, pressure, headache, general weakness and malaise, and greenish d/c, with mild ST and cough, but pt denies chest pain, wheezing, increased sob or doe, orthopnea, PND, increased LE swelling, palpitations, dizziness or syncope.  Pt continues to have recurring LBP without change in severity, bowel or bladder change, fever, wt loss,  worsening LE pain/numbness/weakness, gait change or falls, still has not heard about the pain clinic referral.   Past Medical History  Diagnosis Date  . Chronic back pain   . Cervical disc disease   . Hyperlipidemia   . Cervical arthritis   . Headache(784.0)   . Hypertriglyceridemia   . Hypogonadism in male 08/07/2014  . GERD (gastroesophageal reflux disease)    Past Surgical History  Procedure Laterality Date  . Epidural block injection      dr Carloyn Manner  . Lumbar laminectomy    . Lumbar fusion    . Mouth surgery  1997    reports that he has been smoking.  He has never used smokeless tobacco. He reports that he does not drink alcohol or use illicit drugs. family history includes Alcohol abuse in his father; Breast cancer in his mother; Cancer in his mother; Hypertension in his father; Liver disease in his father. There is no history of Colon cancer, Rectal cancer, or Stomach cancer. Allergies  Allergen Reactions  . Pertussis Vaccines     Had an allergic reaction to DPT vaccine but never to DT   Current Outpatient Prescriptions on File Prior to Visit  Medication Sig Dispense Refill  . betamethasone, augmented, (DIPROLENE) 0.05 % lotion Apply topically daily. qpply mixture to dry skin 2 times a day--1/4 betamethasone lotion with 3/4 eucerinelotion     . butalbital-aspirin-caffeine-codeine (FIORINAL WITH CODEINE) 50-325-40-30 MG capsule Take 1 capsule by mouth every 4 (four) hours as needed. 30 capsule 2  .  Cholecalciferol (VITAMIN D3) 400 UNITS CAPS Take by mouth daily.      . fenofibrate (TRICOR) 145 MG tablet Take 1 tablet (145 mg total) by mouth daily. 90 tablet 3  . fentaNYL (DURAGESIC - DOSED MCG/HR) 75 MCG/HR Place 75 mcg onto the skin every 3 (three) days.     . methadone (DOLOPHINE) 10 MG tablet Take 40 mg by mouth every 8 (eight) hours.     Marland Kitchen MOVIPREP 100 G SOLR Take 1 kit (200 g total) by mouth once. moviprep as directed. No substitutions 1 kit 0  . oxycodone (ROXICODONE) 30 MG immediate release tablet Take 1 tablet (30 mg total) by mouth every 4 (four) hours as needed. To fill Sep 06, 2014 180 tablet 0  . ranitidine (ZANTAC) 150 MG capsule Take 150 mg by mouth 2 (two) times daily.    . SYRINGE-NEEDLE, DISP, 3 ML 25G X 1" 3 ML MISC To use with testosterone injections 50 each 1  . testosterone cypionate (DEPOTESTOTERONE CYPIONATE) 200 MG/ML injection Inject 1 mL (200 mg total) into the muscle every 14 (fourteen) days. 10 mL 5  . vitamin E 400 UNIT capsule Take 400 Units by mouth daily.       No current facility-administered medications on file prior to visit.   Review of Systems  Constitutional: Negative for unusual diaphoresis or other sweats  HENT: Negative for ringing in ear Eyes: Negative for double vision or worsening visual disturbance.  Respiratory: Negative for choking and stridor.   Gastrointestinal: Negative for vomiting or other signifcant bowel change Genitourinary: Negative for hematuria or decreased urine volume.  Musculoskeletal: Negative for other MSK pain or swelling Skin: Negative for color change and worsening wound.  Neurological: Negative for tremors and numbness other than noted  Psychiatric/Behavioral: Negative for decreased concentration or agitation other than above       Objective:   Physical Exam BP 122/62 mmHg  Pulse 98  Temp(Src) 98.6 F (37 C) (Oral)  Ht _0  (1.702 m)  Wt 245 lb (111.131 kg)  BMI 38.36 kg/m2  SpO2 98% VS noted, mild  ill Constitutional: Pt appears well-developed, well-nourished.  HENT: Head: NCAT.  Right Ear: External ear normal.  Left Ear: External ear normal.  Eyes: . Pupils are equal, round, and reactive to light. Conjunctivae and EOM are normal Neck: Normal range of motion. Neck supple.  Bilat tm's with mild erythema.  Max sinus areas mild tender.  Pharynx with mild erythema, no exudate Cardiovascular: Normal rate and regular rhythm.   Pulmonary/Chest: Effort normal and breath sounds normal.  Neurological: Pt is alert. Not confused , motor grossly intact, spine lumbar diffusely mod tender Skin: Skin is warm. No rash Psychiatric: Pt behavior is normal. No agitation.     Assessment & Plan:

## 2014-10-09 NOTE — Progress Notes (Signed)
Pre visit review using our clinic review tool, if applicable. No additional management support is needed unless otherwise documented below in the visit note. 

## 2014-10-09 NOTE — Patient Instructions (Signed)
Please take all new medication as prescribed - the antibiotic  You can also take Mucinex (or it's generic off brand) for congestion, and tylenol as needed for pain  Please continue all other medications as before, and refills have been done if requested - the xanax  Please have the pharmacy call with any other refills you may need.  Please keep your appointments with your specialists as you may have planned  Please feel free to call the office [phone] to inquire about your pain clinic referral ([facility]

## 2014-10-09 NOTE — Assessment & Plan Note (Signed)
Mild to mod, for antibx course,  to f/u any worsening symptoms or concerns 

## 2014-10-09 NOTE — Assessment & Plan Note (Signed)
D/w to pt, cont current tx, [facility]

## 2014-10-20 ENCOUNTER — Encounter: Payer: BC Managed Care – PPO | Admitting: Internal Medicine

## 2014-10-20 ENCOUNTER — Encounter: Payer: Self-pay | Admitting: Internal Medicine

## 2014-10-20 ENCOUNTER — Ambulatory Visit (AMBULATORY_SURGERY_CENTER): Payer: BC Managed Care – PPO | Admitting: Internal Medicine

## 2014-10-20 VITALS — BP 124/87 | HR 77 | Temp 97.4°F | Resp 14 | Ht 67.0 in | Wt 249.0 lb

## 2014-10-20 DIAGNOSIS — Z1211 Encounter for screening for malignant neoplasm of colon: Secondary | ICD-10-CM

## 2014-10-20 MED ORDER — SODIUM CHLORIDE 0.9 % IV SOLN: 500.0000 mL | INTRAVENOUS | Status: DC

## 2014-10-20 NOTE — Patient Instructions (Signed)
Discharge instructions given. Handouts on diverticulosis and a high fiber diet. Resume previous medications. YOU HAD AN ENDOSCOPIC PROCEDURE TODAY AT THE [facility]: Refer to the procedure report that was given to you for any specific questions about what was found during the examination.  If the procedure report does not answer your questions, please call your gastroenterologist to clarify.  If you requested that your care partner not be given the details of your procedure findings, then the procedure report has been included in a sealed envelope for you to review at your convenience later.  YOU SHOULD EXPECT: Some feelings of bloating in the abdomen. Passage of more gas than usual.  Walking can help get rid of the air that was put into your GI tract during the procedure and reduce the bloating. If you had a lower endoscopy (such as a colonoscopy or flexible sigmoidoscopy) you may notice spotting of blood in your stool or on the toilet paper. If you underwent a bowel prep for your procedure, then you may not have a normal bowel movement for a few days.  DIET: Your first meal following the procedure should be a light meal and then it is ok to progress to your normal diet.  A half-sandwich or bowl of soup is an example of a good first meal.  Heavy or fried foods are harder to digest and may make you feel nauseous or bloated.  Likewise meals heavy in dairy and vegetables can cause extra gas to form and this can also increase the bloating.  Drink plenty of fluids but you should avoid alcoholic beverages for 24 hours.  ACTIVITY: Your care partner should take you home directly after the procedure.  You should plan to take it easy, moving slowly for the rest of the day.  You can resume normal activity the day after the procedure however you should NOT DRIVE or use heavy machinery for 24 hours (because of the sedation medicines used during the test).    SYMPTOMS TO REPORT IMMEDIATELY: A  gastroenterologist can be reached at any hour.  During normal business hours, 8:30 AM to 5:00 PM Monday through Friday, call [phone].  After hours and on weekends, please call the GI answering service at [phone] who will take a message and have the physician on call contact you.   Following lower endoscopy (colonoscopy or flexible sigmoidoscopy):  Excessive amounts of blood in the stool  Significant tenderness or worsening of abdominal pains  Swelling of the abdomen that is new, acute  Fever of 100F or higher  FOLLOW UP: If any biopsies were taken you will be contacted by phone or by letter within the next 1-3 weeks.  Call your gastroenterologist if you have not heard about the biopsies in 3 weeks.  Our staff will call the home number listed on your records the next business day following your procedure to check on you and address any questions or concerns that you may have at that time regarding the information given to you following your procedure. This is a courtesy call and so if there is no answer at the home number and we have not heard from you through the emergency physician on call, we will assume that you have returned to your regular daily activities without incident.  SIGNATURES/CONFIDENTIALITY: You and/or your care partner have signed paperwork which will be entered into your electronic medical record.  These signatures attest to the fact that that the information above on your After Visit Summary   has been reviewed and is understood.  Full responsibility of the confidentiality of this discharge information lies with you and/or your care-partner. 

## 2014-10-20 NOTE — Op Note (Signed)
[facility] [address] [place], [id number]   COLONOSCOPY PROCEDURE REPORT  PATIENT: Adrian HippsHankins, Jihan T  MR#: [id number] BIRTHDATE: December 22, 1965 , 48  yrs. old GENDER: male ENDOSCOPIST: Beverley FiedlerJay M Toy Eisemann, MD REFERRED XB:JYNWGBY:James [name], M.D. PROCEDURE DATE:  10/20/2014 PROCEDURE:   Colonoscopy, screening First Screening Colonoscopy - Avg.  risk and is 50 yrs.  old or older Yes.  Prior Negative Screening - Now for repeat screening. N/A  History of Adenoma - Now for follow-up colonoscopy & has been > or = to 3 yrs.  N/A  Polyps Removed Today? Yes. ASA CLASS:   Class II INDICATIONS:average risk for colon cancer and first colonoscopy. MEDICATIONS: Monitored anesthesia care and Propofol 200 mg IV  DESCRIPTION OF PROCEDURE:   After the risks benefits and alternatives of the procedure were thoroughly explained, informed consent was obtained.  The digital rectal exam revealed no rectal mass.   The LB [id number]  endoscope was introduced through the anus and advanced to the cecum, which was identified by both the appendix and ileocecal valve. No adverse events experienced. The quality of the prep was good, using MoviPrep  The instrument was then slowly withdrawn as the colon was fully examined.    COLON FINDINGS: There was mild diverticulosis noted in the ascending colon and sigmoid colon.   The examination was otherwise normal. Retroflexed views revealed no abnormalities. The time to cecum=2 minutes 50 seconds.  Withdrawal time=10 minutes 57 seconds.  The scope was withdrawn and the procedure completed.  COMPLICATIONS: There were no immediate complications.  ENDOSCOPIC IMPRESSION: 1.   Mild diverticulosis was noted in the ascending colon and sigmoid colon 2.   The examination was otherwise normal  RECOMMENDATIONS: 1.  High fiber diet 2.  You should continue to follow colorectal cancer screening guidelines for "routine risk" patients with a repeat colonoscopy in 10  years.  There is no need for FOBT (stool) testing for at least 5 years.  eSigned:  Beverley FiedlerJay M Kysa Calais, MD 10/20/2014 10:29 AM   cc: Corwin LevinsJames W [name], MD and The Patient

## 2014-10-20 NOTE — Progress Notes (Signed)
Pt c/o IV in right ac stinging.  I untaped IV and pulled the cathter back a tiny bit.  IV fluid wide open drip.  No swelling noted at IV site.  IV retaped and pt said it felt better.  I asked him to call us if any changes noted.  He agreed to. Lenice Llamasmaw

## 2014-10-20 NOTE — Progress Notes (Signed)
A/ox3 pleased with MAC, report to Celia RN 

## 2014-10-21 ENCOUNTER — Telehealth: Payer: Self-pay | Admitting: *Deleted

## 2014-10-21 NOTE — Telephone Encounter (Signed)
Message left

## 2014-11-11 ENCOUNTER — Telehealth: Payer: Self-pay | Admitting: Internal Medicine

## 2014-11-11 DIAGNOSIS — G8929 Other chronic pain: Secondary | ICD-10-CM

## 2014-11-11 DIAGNOSIS — M549 Dorsalgia, unspecified: Principal | ICD-10-CM

## 2014-11-11 MED ORDER — OXYCODONE HCL 30 MG PO TABS: 30.0000 mg | ORAL_TABLET | ORAL | Status: DC | PRN

## 2014-11-11 MED ORDER — METHADONE HCL 40 MG PO TBSO: 40.0000 mg | ORAL_TABLET | Freq: Four times a day (QID) | ORAL | Status: DC | PRN

## 2014-11-11 NOTE — Telephone Encounter (Signed)
Patient informed of MD instructions.   See Referral notes from October 2015 regarding pain management appts.

## 2014-11-11 NOTE — Telephone Encounter (Signed)
I no longer treat chronic pain, as relayed previously to pt  Pt was referred previously to pain clinic  Does pt have appt or at least actively pursuing this?    I can provide bridge med if he is moving towards pain management in good faith

## 2014-11-11 NOTE — Telephone Encounter (Signed)
Pt states he called [facility] and there is a long wait list to be seen, he wanted to call back and let MD know and will need medication in between.  [phone]

## 2014-11-11 NOTE — Telephone Encounter (Signed)
Ok for one month each  Please inform pt this is absolutely the last refills I feel comfortable prescribing  I do not treat chronic pain  If pt unable to enter pain clinic in this time, he will need to establish with a new PCP of his choice

## 2014-11-11 NOTE — Telephone Encounter (Signed)
Is requesting scripts for oxycodone and methadone.

## 2014-11-11 NOTE — Telephone Encounter (Signed)
See referral notes.

## 2014-11-12 ENCOUNTER — Telehealth: Payer: Self-pay | Admitting: Internal Medicine

## 2014-11-12 DIAGNOSIS — G894 Chronic pain syndrome: Secondary | ICD-10-CM

## 2014-11-12 NOTE — Telephone Encounter (Signed)
Referral done

## 2014-11-12 NOTE — Telephone Encounter (Signed)
Tried calling sister back Retail banker(rebecca) # listed is no longer in service. LMOM of pt cell stating referral has been place...Adrian Ward/lmb

## 2014-11-12 NOTE — Telephone Encounter (Signed)
Pt sister called in said that she would like for [name] to put in a referral to this pain management Center    [facility]  Number [phone]  They need: All MRI Xrays Any injections that pt may have had regarding his back pain Med list

## 2014-11-12 NOTE — Telephone Encounter (Signed)
Called the patient informed to pickup hardcopy's at the front desk.  Also did inform of PCP instructions on future refills and what PCP would no longer do.  The patient did verbalize understanding of instructions.

## 2014-12-04 ENCOUNTER — Encounter: Payer: Self-pay | Admitting: Internal Medicine

## 2014-12-04 ENCOUNTER — Ambulatory Visit (INDEPENDENT_AMBULATORY_CARE_PROVIDER_SITE_OTHER): Payer: BLUE CROSS/BLUE SHIELD | Admitting: Internal Medicine

## 2014-12-04 VITALS — BP 150/98 | HR 83 | Temp 97.9°F | Ht 67.0 in | Wt 240.4 lb

## 2014-12-04 DIAGNOSIS — M519 Unspecified thoracic, thoracolumbar and lumbosacral intervertebral disc disorder: Secondary | ICD-10-CM

## 2014-12-04 DIAGNOSIS — R030 Elevated blood-pressure reading, without diagnosis of hypertension: Secondary | ICD-10-CM

## 2014-12-04 DIAGNOSIS — M545 Low back pain, unspecified: Secondary | ICD-10-CM | POA: Insufficient documentation

## 2014-12-04 DIAGNOSIS — M25512 Pain in left shoulder: Secondary | ICD-10-CM

## 2014-12-04 DIAGNOSIS — M549 Dorsalgia, unspecified: Secondary | ICD-10-CM

## 2014-12-04 DIAGNOSIS — IMO0001 Reserved for inherently not codable concepts without codable children: Secondary | ICD-10-CM

## 2014-12-04 DIAGNOSIS — G8929 Other chronic pain: Secondary | ICD-10-CM

## 2014-12-04 MED ORDER — KETOROLAC TROMETHAMINE 30 MG/ML IJ SOLN
30.0000 mg | Freq: Once | INTRAMUSCULAR | Status: AC
  Administered 2014-12-04: 30 mg via INTRAMUSCULAR

## 2014-12-04 MED ORDER — CYCLOBENZAPRINE HCL 5 MG PO TABS: 5.0000 mg | ORAL_TABLET | Freq: Three times a day (TID) | ORAL | Status: DC | PRN

## 2014-12-04 NOTE — Progress Notes (Signed)
Pre visit review using our clinic review tool, if applicable. No additional management support is needed unless otherwise documented below in the visit note. 

## 2014-12-04 NOTE — Progress Notes (Signed)
Subjective:    Patient ID: Adrian Ward, male    DOB: 1966-07-09, 49 y.o.   MRN: [id number]  HPI  Here with acute on lower back pain, started after shoveling snow 3 days ago, gradually worse overall in severity despite current med tx, but no bowel or bladder change, fever, wt loss,  worsening LE pain/numbness/weakness, or falls.  Also pain to left shoulder, achy, mild with neck or radicular pain.  Missed work last 3 days.  Pt denies chest pain, increased sob or doe, wheezing, orthopnea, PND, increased LE swelling, palpitations, dizziness or syncope.  Pt denies new neurological symptoms such as new headache, or facial or extremity weakness or numbness   Pt denies polydipsia, polyuria  Asks for referral to [facility] for f/u Past Medical History  Diagnosis Date  . Chronic back pain   . Cervical disc disease   . Hyperlipidemia   . Cervical arthritis   . Headache(784.0)   . Hypertriglyceridemia   . Hypogonadism in male 08/07/2014  . GERD (gastroesophageal reflux disease)    Past Surgical History  Procedure Laterality Date  . Epidural block injection      dr Channing Muttersroy  . Lumbar laminectomy    . Lumbar fusion    . Mouth surgery  1997    reports that he has been smoking.  He has never used smokeless tobacco. He reports that he does not drink alcohol or use illicit drugs. family history includes Alcohol abuse in his father; Breast cancer in his mother; Cancer in his mother; Hypertension in his father; Liver disease in his father. There is no history of Colon cancer, Rectal cancer, Stomach cancer, or Esophageal cancer. Allergies  Allergen Reactions  . Pertussis Vaccines     Had an allergic reaction to DPT vaccine but never to DT   Current Outpatient Prescriptions on File Prior to Visit  Medication Sig Dispense Refill  . alprazolam (XANAX) 2 MG tablet Take 1 tablet (2 mg total) by mouth daily as needed for sleep. 30 tablet 5  . betamethasone, augmented, (DIPROLENE) 0.05 % lotion Apply topically daily.  qpply mixture to dry skin 2 times a day--1/4 betamethasone lotion with 3/4 eucerinelotion     . butalbital-aspirin-caffeine-codeine (FIORINAL WITH CODEINE) 50-325-40-30 MG capsule Take 1 capsule by mouth every 4 (four) hours as needed. 30 capsule 2  . Cholecalciferol (VITAMIN D3) 400 UNITS CAPS Take by mouth daily.      . fenofibrate (TRICOR) 145 MG tablet Take 1 tablet (145 mg total) by mouth daily. 90 tablet 3  . fentaNYL (DURAGESIC - DOSED MCG/HR) 75 MCG/HR Place 75 mcg onto the skin every 3 (three) days.     . methadone (METHADOSE) 40 MG disintegrating tablet Take 1 tablet (40 mg total) by mouth every 6 (six) hours as needed. 120 tablet 0  . oxycodone (ROXICODONE) 30 MG immediate release tablet Take 1 tablet (30 mg total) by mouth every 4 (four) hours as needed. To fill Nov 12, 2014 180 tablet 0  . ranitidine (ZANTAC) 150 MG capsule Take 150 mg by mouth 2 (two) times daily.    . SYRINGE-NEEDLE, DISP, 3 ML 25G X 1" 3 ML MISC To use with testosterone injections 50 each 1  . testosterone cypionate (DEPOTESTOTERONE CYPIONATE) 200 MG/ML injection Inject 1 mL (200 mg total) into the muscle every 14 (fourteen) days. 10 mL 5  . vitamin E 400 UNIT capsule Take 400 Units by mouth daily.       No current facility-administered medications on  file prior to visit.   Review of Systems  Constitutional: Negative for unusual diaphoresis or other sweats  HENT: Negative for ringing in ear Eyes: Negative for double vision or worsening visual disturbance.  Respiratory: Negative for choking and stridor.   Gastrointestinal: Negative for vomiting or other signifcant bowel change Genitourinary: Negative for hematuria or decreased urine volume.  Musculoskeletal: Negative for other MSK pain or swelling Skin: Negative for color change and worsening wound.  Neurological: Negative for tremors and numbness other than noted  Psychiatric/Behavioral: Negative for decreased concentration or agitation other than above         Objective:   Physical Exam BP 150/98 mmHg  Pulse 83  Temp(Src) 97.9 F (36.6 C) (Oral)  Ht  (1.702 m)  Wt 240 lb 6 oz (109.033 kg)  BMI 37.64 kg/m2  SpO2 97% VS noted,  Constitutional: Pt appears well-developed, well-nourished.  HENT: Head: NCAT.  Right Ear: External ear normal.  Left Ear: External ear normal.  Eyes: . Pupils are equal, round, and reactive to light. Conjunctivae and EOM are normal Neck: Normal range of motion. Neck supple.  Cardiovascular: Normal rate and regular rhythm.   Pulmonary/Chest: Effort normal and breath sounds without rales or wheezing.  Abd:  Soft, NT, ND, + BS Neurological: Pt is alert. Not confused , motor intact 5/5, sens intact, dtr symmetric Spine diffuse tender with bilat lumbar paravertebral tender, no swelling or rash Skin: Skin is warm. No rash Psychiatric: Pt behavior is normal. No agitation.  Left shoulder with FROM, mild diffuse tender    Assessment & Plan:

## 2014-12-04 NOTE — Patient Instructions (Signed)
You had the pain shot today (toradol)  Please take all new medication as prescribed - the muscle relaxer  You will be contacted regarding the referral for: [place] [facility]  You are given the work note  Please continue all other medications as before, and refills have been done if requested.  Please have the pharmacy call with any other refills you may need.  Please keep your appointments with your specialists as you may have planned

## 2014-12-06 DIAGNOSIS — M25512 Pain in left shoulder: Secondary | ICD-10-CM | POA: Insufficient documentation

## 2014-12-06 DIAGNOSIS — IMO0001 Reserved for inherently not codable concepts without codable children: Secondary | ICD-10-CM | POA: Insufficient documentation

## 2014-12-06 DIAGNOSIS — R030 Elevated blood-pressure reading, without diagnosis of hypertension: Secondary | ICD-10-CM

## 2014-12-06 NOTE — Assessment & Plan Note (Signed)
C/w strain related to recent shoveling, exam benign, for nsaid prn,  to f/u any worsening symptoms or concerns

## 2014-12-06 NOTE — Assessment & Plan Note (Signed)
BP Readings from Last 3 Encounters:  12/04/14 150/98  10/20/14 124/87  10/09/14 122/62   elev BP today, likely reactive,  to f/u any worsening symptoms or concerns, cont to monitor BP at home and next visit

## 2014-12-06 NOTE — Addendum Note (Signed)
Addended by: Corwin LevinsJOHN, Bartlett Enke W on: 12/06/2014 04:51 PM   Modules accepted: Level of Service, SmartSet

## 2014-12-06 NOTE — Assessment & Plan Note (Addendum)
C/w msk lower back strain on top of chronic pain , for nsaid prn, muscle relaxer, work note given, consider PT if not improved in 3-5 days, refer NS per pt request

## 2014-12-06 NOTE — Assessment & Plan Note (Signed)
O/w stable, to cont current tx, pain management

## 2014-12-10 ENCOUNTER — Telehealth: Payer: Self-pay | Admitting: Internal Medicine

## 2014-12-10 NOTE — Telephone Encounter (Signed)
Patient needs another note for being out of work.

## 2014-12-10 NOTE — Telephone Encounter (Signed)
Ok for note, robin to help please

## 2014-12-10 NOTE — Telephone Encounter (Signed)
What dates was he thinking about?

## 2014-12-10 NOTE — Telephone Encounter (Signed)
Patient went to work on Monday 12/08/14 and did work all day, but was in a lot of pain.   He states the pain has improved through his shoulders, but low back pain and legs are still hurting.  He thinks being out one more week would be helpful.

## 2014-12-11 NOTE — Telephone Encounter (Signed)
Letter done and called the patient to inform letter ready for pickup at the front desk.

## 2014-12-15 ENCOUNTER — Telehealth: Payer: Self-pay | Admitting: Internal Medicine

## 2014-12-15 NOTE — Telephone Encounter (Signed)
Needs ROV please 

## 2014-12-15 NOTE — Telephone Encounter (Signed)
Patient states back is still hurting.  He is requesting a letter to be out longer from work.

## 2014-12-16 NOTE — Telephone Encounter (Signed)
Got patient scheduled

## 2014-12-17 ENCOUNTER — Ambulatory Visit (INDEPENDENT_AMBULATORY_CARE_PROVIDER_SITE_OTHER): Payer: BLUE CROSS/BLUE SHIELD | Admitting: Internal Medicine

## 2014-12-17 ENCOUNTER — Encounter: Payer: Self-pay | Admitting: Internal Medicine

## 2014-12-17 VITALS — BP 128/84 | HR 105 | Temp 98.1°F | Ht 67.0 in | Wt 242.0 lb

## 2014-12-17 DIAGNOSIS — M25512 Pain in left shoulder: Secondary | ICD-10-CM

## 2014-12-17 DIAGNOSIS — G8929 Other chronic pain: Secondary | ICD-10-CM

## 2014-12-17 DIAGNOSIS — M5442 Lumbago with sciatica, left side: Secondary | ICD-10-CM

## 2014-12-17 DIAGNOSIS — M549 Dorsalgia, unspecified: Secondary | ICD-10-CM

## 2014-12-17 MED ORDER — OXYCODONE HCL 30 MG PO TABS: 30.0000 mg | ORAL_TABLET | ORAL | Status: DC | PRN

## 2014-12-17 MED ORDER — FENTANYL 75 MCG/HR TD PT72: 75.0000 ug | MEDICATED_PATCH | TRANSDERMAL | Status: DC

## 2014-12-17 MED ORDER — METHADONE HCL 40 MG PO TBSO: 40.0000 mg | ORAL_TABLET | Freq: Four times a day (QID) | ORAL | Status: DC | PRN

## 2014-12-17 NOTE — Progress Notes (Signed)
Subjective:    Patient ID: Adrian Ward, male    DOB: [birth year], 49 y.o.   MRN: [id number]  HPI Here to f/u, unfortunately little improved but not significantly worse than last vist, Pt continues to have recurring left > right LBP without change in severity, bowel or bladder change, fever, wt loss,  worsening LE pain/numbness/weakness, gait change or falls except for radiation to prox LLE, pain still mod to severe, meds helped little so far. Has been out of work.  Also with ongoing Left shoulder pain has improved to mild to modl,still feels he cannot do usual activities of lifting, pulling, crawling at work due to this.  Any arm moveement tends to make back pain worse.   Not yet accepted at pain clinic, but has been in contact.  Needs refills. Not yet notified of referral to Dr Roy/NS, but hopefully soon.  Needs FMLA - d/w pt for out of work Jan 26 to a return to work date of Feb 22 Past Medical History  Diagnosis Date  . Chronic back pain   . Cervical disc disease   . Hyperlipidemia   . Cervical arthritis   . Headache(784.0)   . Hypertriglyceridemia   . Hypogonadism in male 08/07/2014  . GERD (gastroesophageal reflux disease)    Past Surgical History  Procedure Laterality Date  . Epidural block injection      dr Channing Mutters  . Lumbar laminectomy    . Lumbar fusion    . Mouth surgery  1997    reports that he has been smoking.  He has never used smokeless tobacco. He reports that he does not drink alcohol or use illicit drugs. family history includes Alcohol abuse in his father; Breast cancer in his mother; Cancer in his mother; Hypertension in his father; Liver disease in his father. There is no history of Colon cancer, Rectal cancer, Stomach cancer, or Esophageal cancer. Allergies  Allergen Reactions  . Pertussis Vaccines     Had an allergic reaction to DPT vaccine but never to DT   Current Outpatient Prescriptions on File Prior to Visit  Medication Sig Dispense Refill  . alprazolam  (XANAX) 2 MG tablet Take 1 tablet (2 mg total) by mouth daily as needed for sleep. 30 tablet 5  . betamethasone, augmented, (DIPROLENE) 0.05 % lotion Apply topically daily. qpply mixture to dry skin 2 times a day--1/4 betamethasone lotion with 3/4 eucerinelotion     . butalbital-aspirin-caffeine-codeine (FIORINAL WITH CODEINE) 50-325-40-30 MG capsule Take 1 capsule by mouth every 4 (four) hours as needed. 30 capsule 2  . Cholecalciferol (VITAMIN D3) 400 UNITS CAPS Take by mouth daily.      . cyclobenzaprine (FLEXERIL) 5 MG tablet Take 1 tablet (5 mg total) by mouth 3 (three) times daily as needed for muscle spasms. 60 tablet 1  . fenofibrate (TRICOR) 145 MG tablet Take 1 tablet (145 mg total) by mouth daily. 90 tablet 3  . ranitidine (ZANTAC) 150 MG capsule Take 150 mg by mouth 2 (two) times daily.    . SYRINGE-NEEDLE, DISP, 3 ML 25G X 1" 3 ML MISC To use with testosterone injections 50 each 1  . testosterone cypionate (DEPOTESTOTERONE CYPIONATE) 200 MG/ML injection Inject 1 mL (200 mg total) into the muscle every 14 (fourteen) days. 10 mL 5  . vitamin E 400 UNIT capsule Take 400 Units by mouth daily.       No current facility-administered medications on file prior to visit.    Review of Systems All  otherwise neg per pt     Objective:   Physical Exam BP 128/84 mmHg  Pulse 105  Temp(Src) 98.1 F (36.7 C) (Oral)  Ht 5\' 7"  (1.702 m)  Wt 242 lb (109.77 kg)  BMI 37.89 kg/m2 VS noted,  Constitutional: Pt appears well-developed, well-nourished.  HENT: Head: NCAT.  Right Ear: External ear normal.  Left Ear: External ear normal.  Eyes: . Pupils are equal, round, and reactive to light. Conjunctivae and EOM are normal Neck: Normal range of motion. Neck supple.  Cardiovascular: Normal rate and regular rhythm.   Pulmonary/Chest: Effort normal and breath sounds without rales or wheezing.  Abd:  Soft, NT, ND, + BS Neurological: Pt is alert. Not confused , motor grossly intact Skin: Skin is  warm. No rash Psychiatric: Pt behavior is normal. No agitation.  Spine nontender but marked left > right paravertebral spasm/tender without buttock or prox leg tender, swelling or rash Left shoulder with tender subacromail, decreased abduction    Assessment & Plan:

## 2014-12-17 NOTE — Progress Notes (Signed)
Pre visit review using our clinic review tool, if applicable. No additional management support is needed unless otherwise documented below in the visit note. 

## 2014-12-17 NOTE — Patient Instructions (Signed)
Please continue all other medications as before, and refills have been done if requested - the pain medications  Your disability forms are to be filled out  You will be contacted regarding the referral for: Dr Channing Muttersoy, but also Dr Katrinka BlazingSmith (sport med in this office)  Please keep your appointments with your specialists as you may have planned

## 2014-12-18 ENCOUNTER — Telehealth: Payer: Self-pay | Admitting: Internal Medicine

## 2014-12-18 NOTE — Telephone Encounter (Signed)
[name] pt said he was in office on 12/18/14 and saw you, Was given an work note for feb 10-27. Need work note for Feb 8 and 9th .

## 2014-12-22 NOTE — Assessment & Plan Note (Signed)
Some improved, ? Bursitis, for sport med referral

## 2014-12-22 NOTE — Assessment & Plan Note (Signed)
Ok for bridge meds while waiting for pain clinic referral process.

## 2014-12-22 NOTE — Assessment & Plan Note (Signed)
Has evidence for bilat MSK spasm still mod to severe by exam, and subjective left sciatica.No neuro change by exam. For cont'd pain control, plans to f/u with Dr Roy/NS next available, ok for Out of work, and FMLA to be filled out for specific time missed

## 2014-12-23 DIAGNOSIS — Z0279 Encounter for issue of other medical certificate: Secondary | ICD-10-CM

## 2014-12-24 ENCOUNTER — Ambulatory Visit (INDEPENDENT_AMBULATORY_CARE_PROVIDER_SITE_OTHER): Payer: BLUE CROSS/BLUE SHIELD | Admitting: Family Medicine

## 2014-12-24 ENCOUNTER — Encounter: Payer: Self-pay | Admitting: Family Medicine

## 2014-12-24 VITALS — BP 140/94 | HR 115 | Wt 242.0 lb

## 2014-12-24 DIAGNOSIS — M5137 Other intervertebral disc degeneration, lumbosacral region: Secondary | ICD-10-CM

## 2014-12-24 MED ORDER — GABAPENTIN 100 MG PO CAPS: 200.0000 mg | ORAL_CAPSULE | Freq: Every day | ORAL | Status: DC

## 2014-12-24 NOTE — Progress Notes (Signed)
[facility] [address] [place], [place] [address] Phone: [phone] Subjective:    I'm seeing this patient by the request  of:  Oliver Barre, MD   CC: chronic back pain  [id number] Adrian Ward is a 49 y.o. male coming in with complaint of chronic back pain. Patient has had known degenerative disc disease for quite some time and has had even back surgery.patient has had a lumbar laminectomy with a corticectomy previously and then did have a lumbar fusion. Patient also has chronic cervical disc disease as well.last surgery was greater than 2 years ago. Patient states that unfortunately continues to have chronic back pain. Patient is taking fentanyl, oxycodone, methadone on a regular basis. Patient states that this barely allows him to function. Patient was recently given a muscle relaxer which does seem to make him sleepy. Patient states that from time to time he can have radiating pain down his legs and into his testicle. Patient denies though any weakness. Patient states though that he does have a constant dull aching pain of the lumbar spine at all times. Patient has been referred to a pain clinic but has not been there yet.patient has been out of work for quite some time secondary to his disability. Patient did have a FMLA paperwork filled out by primary care provider recently. Patient rates the severity of pain as 9 out of 10. States it is even difficult to do regular daily activities secondary to the pain. Patient states that he is scared to even do activity out of the ordinary because of the possibility for worsening pain.     Past medical history, social, surgical and family history all reviewed in electronic medical record.   Review of Systems: No headache, visual changes, nausea, vomiting, diarrhea, constipation, dizziness, abdominal pain, skin rash, fevers, chills, night sweats, weight loss, swollen lymph nodes, body aches, joint swelling, muscle  aches, chest pain, shortness of breath, mood changes.   Objective Blood pressure 140/94, pulse 115, weight 242 lb (109.77 kg), SpO2 97 %.  General: No apparent distress alert and oriented x3 mood and affect normal, dressed appropriately. overweight HEENT: Pupils equal, extraocular movements intact  Respiratory: Patient's speak in full sentences and does not appear short of breath  Cardiovascular: No lower extremity edema, non tender, no erythema  Skin: Warm dry intact with no signs of infection or rash on extremities or on axial skeleton.  Abdomen: Soft nontender  Neuro: Cranial nerves II through XII are intact, neurovascularly intact in all extremities with 2+ DTRs and 2+ pulses.  Lymph: No lymphadenopathy of posterior or anterior cervical chain or axillae bilaterally.  Gait normal with good balance and coordination.  MSK:  Non tender with full range of motion and good stability and symmetric strength and tone of shoulders, elbows, wrist, hip, knee and ankles bilaterally.  Back Exam:  Inspection: Unremarkable  Motion: Flexion 25 deg, Extension 15 deg, Side Bending to 25 deg bilaterally,  Rotation to 25 deg bilaterally  SLR laying: Negative  XSLR laying: Negative  Palpable tenderness: tenderness in the paraspinal musculature mostly on the left side of the lumbar spine mild muscle spasm noted FABER: negative. Sensory change: Gross sensation intact to all lumbar and sacral dermatomes.  Reflexes: 2+ at both patellar tendons, 2+ at achilles tendons, Babinski's downgoing.  Strength at foot  Plantar-flexion: 5/5 Dorsi-flexion: 5/5 Eversion: 5/5 Inversion: 4/5  Leg strength  Quad: 4/5 Hamstring: 4/5 Hip flexor: 4/5 Hip abductors: 4/5 but symmetric Gait  unremarkable.     Impression and Recommendations:     This case required medical decision making of moderate complexity.

## 2014-12-24 NOTE — Progress Notes (Signed)
Pre visit review using our clinic review tool, if applicable. No additional management support is needed unless otherwise documented below in the visit note. 

## 2014-12-24 NOTE — Assessment & Plan Note (Signed)
Patient's back pain does have some severe underlying pathology. Patient does have previous history of surgeries. 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 We may need to repeat x-rays if patient does not make any significant improvement. I do think that pain medicine referral would be beneficial for this individual who is already on high-dose pain medications. We discussed gabapentinor milligrams at night to see if this flap was some of the radicular symptoms. We also discussed over-the-counter natural supplementations and may also help decrease some inflammation. Discussed with patient that this will likely not improve immediately and we'll take some time. We will may need to consider aquatic formal physical therapy. We'll discuss this. We also discussed the possibility of orthotics to help with aligning patient's back. Patient's will remain out of work per her primary care's orders at this time.

## 2014-12-24 NOTE — Patient Instructions (Signed)
Good to see you Ice 20 minutes 2 times daily. Usually after activity and before bed. Try the pennsaid topically 2 times a day as needed Gabapentin 200mg  at night for next 2 weeks then 300mg  at night thereafter.   Glucosamine sulfate 1500mg  a day is a supplement that has been shown to help moderate to severe arthritis. Vitamin D 2000 IU daily Fish oil 2 grams daily.  Tumeric 500mg  twice daily.  Capsaicin topically up to four times a day may also help with pain. We can consider orthotics to help with alignment.  Water aerobics and cycling with low resistance are the best two types of exercise for arthritis. Come back and see me in 3 weeks.

## 2014-12-29 ENCOUNTER — Telehealth: Payer: Self-pay | Admitting: Internal Medicine

## 2014-12-29 DIAGNOSIS — M5416 Radiculopathy, lumbar region: Secondary | ICD-10-CM

## 2014-12-29 NOTE — Telephone Encounter (Signed)
Patient states Dr. Jonny RuizJohn wrote him out of work until today.  He states his back was in pain that he still was not able to go to work.  He is requesting a letter to be out of work for more time.

## 2014-12-30 ENCOUNTER — Other Ambulatory Visit: Payer: Self-pay | Admitting: *Deleted

## 2014-12-30 DIAGNOSIS — M549 Dorsalgia, unspecified: Principal | ICD-10-CM

## 2014-12-30 DIAGNOSIS — G8929 Other chronic pain: Secondary | ICD-10-CM

## 2014-12-30 NOTE — Telephone Encounter (Signed)
I have not seen him for some time.  He can follow up with me sooner, say on Monday.  We need xrays and further information.

## 2014-12-30 NOTE — Telephone Encounter (Signed)
Spoke to pt, advised him if he's in that much pain then we need to go ahead & get an x-ray & CT. Pt understood, order entered & pt scheduled for Monday to see Dr. Katrinka BlazingSmith.

## 2014-12-30 NOTE — Telephone Encounter (Signed)
Patient needs a note from Dr. Katrinka BlazingSmith to be out of work.

## 2014-12-30 NOTE — Telephone Encounter (Signed)
lmovm for pt to return call.  

## 2014-12-30 NOTE — Addendum Note (Signed)
Addended by: Edwena FeltyARSON, LINDSAY T on: 12/30/2014 04:58 PM   Modules accepted: Orders

## 2015-01-01 ENCOUNTER — Telehealth: Payer: Self-pay | Admitting: Family Medicine

## 2015-01-01 NOTE — Telephone Encounter (Signed)
Patient is confused about when he needs to get an xray.  Please call back in regards.  Can reach on the home or work number.

## 2015-01-02 NOTE — Telephone Encounter (Signed)
msg left on vmail letting pt know he can come in 20-30 min early on Monday to get his x-ray done before he sees us.

## 2015-01-05 ENCOUNTER — Ambulatory Visit (INDEPENDENT_AMBULATORY_CARE_PROVIDER_SITE_OTHER): Payer: BLUE CROSS/BLUE SHIELD | Admitting: Family Medicine

## 2015-01-05 ENCOUNTER — Encounter: Payer: Self-pay | Admitting: *Deleted

## 2015-01-05 ENCOUNTER — Encounter: Payer: Self-pay | Admitting: Family Medicine

## 2015-01-05 ENCOUNTER — Ambulatory Visit (INDEPENDENT_AMBULATORY_CARE_PROVIDER_SITE_OTHER)
Admission: RE | Admit: 2015-01-05 | Discharge: 2015-01-05 | Disposition: A | Discharge status: Home / Self Care | Payer: BLUE CROSS/BLUE SHIELD | Source: Ambulatory Visit | Attending: Family Medicine | Admitting: Family Medicine

## 2015-01-05 VITALS — BP 124/84 | HR 103 | Ht 67.0 in | Wt 246.0 lb

## 2015-01-05 DIAGNOSIS — M549 Dorsalgia, unspecified: Secondary | ICD-10-CM

## 2015-01-05 DIAGNOSIS — G8929 Other chronic pain: Secondary | ICD-10-CM

## 2015-01-05 DIAGNOSIS — M5416 Radiculopathy, lumbar region: Secondary | ICD-10-CM

## 2015-01-05 NOTE — Assessment & Plan Note (Signed)
Importantly patient's chronic back pain do show findings of progression on x-ray today compared to his previous MRI in 2012. I do think that an adjacent segment disease could be contributing to some of his pain as well as potentially some spinal stenosis. Do not see any loosening of the fusion on x-ray but we will get a CT myelogram for further evaluation. I feel that this would be more likely to show us more information in the MRI. We discussed that there is a possibility for us to repeat an MRI but once again area on the side of the CT myelogram. Patient will have this done and come back to see me 1-2 days later for further evaluation. Patient did have his FMLA paperwork filled out today and faxed into his employer. Patient will be on light duty until we follow-up and discuss neck steps.  Spent  25 minutes with patient face-to-face and had greater than 50% of counseling including as described above in assessment and plan.

## 2015-01-05 NOTE — Progress Notes (Signed)
  Tawana ScaleZach Isidore Margraf D.O. [facility] [address] [place], [place] [address] Phone: [phone] Subjective:    I'm seeing this patient by the request  of:  Oliver BarreJames [name], MD   CC: chronic back pain  OZH:YQMVHQIONGHPI:Subjective Ronita HippsJoseph T Rigor is a 49 y.o. male coming in with complaint of chronic back pain.  Please see previous note for further information patient's past medical history. Patient continues to have pain at this time and does not seem to be improving. Patient did have x-rays. These were reviewed by me today. Patient's x-rays show that patient's interbody fusion of L5-S1 seems to be in normal alignment. She continues to have significant amount of pain that even his chronic narcotics does not seem to completely resolved. 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 Patient states that if he does any type of activity the ordinary regular daily activities he has severe pain. Denies though any significant radiation down the leg.         Past medical history, social, surgical and family history all reviewed in electronic medical record.   Review of Systems: No headache, visual changes, nausea, vomiting, diarrhea, constipation, dizziness, abdominal pain, skin rash, fevers, chills, night sweats, weight loss, swollen lymph nodes, body aches, joint swelling, muscle aches, chest pain, shortness of breath, mood changes.   Objective Blood pressure 124/84, pulse 103, height 5\' 7"  (1.702 m), weight 246 lb (111.585 kg), SpO2 96 %.  General: No apparent distress alert and oriented x3 mood and affect normal, dressed appropriately. overweight HEENT: Pupils equal, extraocular movements intact  Respiratory: Patient's speak in full sentences and does not appear short of breath  Cardiovascular: No lower extremity edema, non tender, no erythema  Skin: Warm dry intact with no signs of  infection or rash on extremities or on axial skeleton.  Abdomen: Soft nontender  Neuro: Cranial nerves II through XII are intact, neurovascularly intact in all extremities with 2+ DTRs and 2+ pulses.  Lymph: No lymphadenopathy of posterior or anterior cervical chain or axillae bilaterally.  Gait normal with good balance and coordination.  MSK:  Non tender with full range of motion and good stability and symmetric strength and tone of shoulders, elbows, wrist, hip, knee and ankles bilaterally.  Back Exam:  Inspection: Unremarkable  Motion: Flexion 25 deg, Extension 15 deg, Side Bending to 25 deg bilaterally,  Rotation to 25 deg bilaterally  SLR laying: Negative  XSLR laying: Negative  Palpable tenderness: tenderness in the paraspinal musculature mostly on the left side of the lumbar spine mild muscle spasm noted about the same as previous exam FABER: negative. Sensory change: Gross sensation intact to all lumbar and sacral dermatomes.  Reflexes: 2+ at both patellar tendons, 2+ at achilles tendons, Babinski's downgoing.  Strength at foot  Plantar-flexion: 5/5 Dorsi-flexion: 5/5 Eversion: 5/5 Inversion: 4/5  Leg strength  Quad: 4/5 Hamstring: 4/5 Hip flexor: 4/5 Hip abductors: 4/5 but symmetric Gait unremarkable.     Impression and Recommendations:     This case required medical decision making of moderate complexity.

## 2015-01-05 NOTE — Addendum Note (Signed)
Addended by: Edwena FeltyARSON, Kimberly Nieland T on: 01/05/2015 02:46 PM   Modules accepted: Orders

## 2015-01-05 NOTE — Progress Notes (Signed)
Pre visit review using our clinic review tool, if applicable. No additional management support is needed unless otherwise documented below in the visit note. 

## 2015-01-05 NOTE — Patient Instructions (Addendum)
Good to see you We will get a ct myelogram to see what going on.  We will get the scan and then see me again 1-2 days after the CT and we will discuss next step.

## 2015-01-06 DIAGNOSIS — Z0279 Encounter for issue of other medical certificate: Secondary | ICD-10-CM

## 2015-01-12 ENCOUNTER — Ambulatory Visit
Admission: RE | Admit: 2015-01-12 | Discharge: 2015-01-12 | Disposition: A | Discharge status: Home / Self Care | Payer: BLUE CROSS/BLUE SHIELD | Source: Ambulatory Visit | Attending: Family Medicine | Admitting: Family Medicine

## 2015-01-12 DIAGNOSIS — M5126 Other intervertebral disc displacement, lumbar region: Secondary | ICD-10-CM

## 2015-01-12 DIAGNOSIS — M5442 Lumbago with sciatica, left side: Secondary | ICD-10-CM

## 2015-01-12 DIAGNOSIS — M549 Dorsalgia, unspecified: Principal | ICD-10-CM

## 2015-01-12 DIAGNOSIS — M545 Low back pain, unspecified: Secondary | ICD-10-CM

## 2015-01-12 DIAGNOSIS — G8929 Other chronic pain: Secondary | ICD-10-CM

## 2015-01-12 DIAGNOSIS — M5137 Other intervertebral disc degeneration, lumbosacral region: Secondary | ICD-10-CM

## 2015-01-12 MED ORDER — ONDANSETRON HCL 4 MG/2ML IJ SOLN
4.0000 mg | Freq: Once | INTRAMUSCULAR | Status: AC
  Administered 2015-01-12: 4 mg via INTRAMUSCULAR

## 2015-01-12 MED ORDER — MEPERIDINE HCL 100 MG/ML IJ SOLN
100.0000 mg | Freq: Once | INTRAMUSCULAR | Status: AC
  Administered 2015-01-12: 100 mg via INTRAMUSCULAR

## 2015-01-12 MED ORDER — IOHEXOL 180 MG/ML  SOLN
15.0000 mL | Freq: Once | INTRAMUSCULAR | Status: AC | PRN
  Administered 2015-01-12: 12 mL via INTRATHECAL

## 2015-01-12 MED ORDER — DIAZEPAM 5 MG PO TABS
10.0000 mg | ORAL_TABLET | Freq: Once | ORAL | Status: AC
  Administered 2015-01-12: 10 mg via ORAL

## 2015-01-12 NOTE — Discharge Instructions (Signed)

## 2015-01-14 ENCOUNTER — Ambulatory Visit (INDEPENDENT_AMBULATORY_CARE_PROVIDER_SITE_OTHER): Payer: BLUE CROSS/BLUE SHIELD | Admitting: Family Medicine

## 2015-01-14 ENCOUNTER — Encounter: Payer: Self-pay | Admitting: Family Medicine

## 2015-01-14 VITALS — BP 130/82 | HR 98 | Ht 67.0 in | Wt 246.0 lb

## 2015-01-14 DIAGNOSIS — M48062 Spinal stenosis, lumbar region with neurogenic claudication: Secondary | ICD-10-CM

## 2015-01-14 DIAGNOSIS — M4806 Spinal stenosis, lumbar region: Secondary | ICD-10-CM

## 2015-01-14 MED ORDER — GABAPENTIN 100 MG PO CAPS: ORAL_CAPSULE | ORAL | Status: DC

## 2015-01-14 NOTE — Patient Instructions (Signed)
Good to see you Adrian Ward is your friend Gabapentin 100mg  in AM, 100mg  in PM and 200mg  at night, then in 1 week try to increase to 300mg   Continue your other meds Neurosurgeon will be calling you

## 2015-01-14 NOTE — Assessment & Plan Note (Signed)
Discussed with patient at great length. 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 In addition of this patient has not responded to the neuro modular but we will increase patient's gabapentin as long as he can tolerate it. I do feel due to the severe spinal stenosis noted at the L4-L5 region that neurosurgical consult as needed. Patient is having no upper motor neuron findings at this time but patient does have significant compression on the thecal sac. Patient states that he does sometimes have a discomfort in his testicle. Patient will be referred for further evaluation and we'll discuss with certain about possible different interventions.  Spent  25 minutes with patient face-to-face and had greater than 50% of counseling including as described above in assessment and plan.

## 2015-01-14 NOTE — Progress Notes (Signed)
Pre visit review using our clinic review tool, if applicable. No additional management support is needed unless otherwise documented below in the visit note. 

## 2015-01-14 NOTE — Progress Notes (Signed)
  [facility]awana ScaleZach Maclovio Ward D.O. [facility] [address] [place], [place] [address] Phone: [phone] Subjective:     CC: chronic back pain follow up  ONG:[id number]:Subjective Adrian HippsJoseph T Ward is a 49 y.o. male coming in with complaint of chronic back pain.  Please see previous note for further information patient's past medical history. Patient continues to have pain at this time and does not seem to be improving. Patient did have x-rays. These were reviewed by me today. Patient's x-rays show that patient's interbody fusion of L5-S1 seems to be in normal alignment.patient continues to be in significant amount of pain and states that he can only stand for proximally 5-10 minutes before his legs seem to be weak. Denies any bowel or bladder incontinence.   Patient continued on his chronic narcotics. Patient continued to have pain so a CT angiogram was ordered.  Patient's CT angiogram showed patient has had progression with prominent severe circumferential narrowing of the thecal sac at L4-L5. Patient also has osteophytic changes in multiple other levels. Patient has had significant progression of spinal stenosis at L4-L5 which is the level above the prior laminectomy. Patient's cage fusion though does not show any significant loosening.        Past medical history, social, surgical and family history all reviewed in electronic medical record.   Review of Systems: No headache, visual changes, nausea, vomiting, diarrhea, constipation, dizziness, abdominal pain, skin rash, fevers, chills, night sweats, weight loss, swollen lymph nodes, body aches, joint swelling, muscle aches, chest pain, shortness of breath, mood changes.   Objective Blood pressure 130/82, pulse 98, height 5\' 7"  (1.702 m), weight 246 lb (111.585 kg), SpO2 97 %.  General: No apparent distress alert and oriented x3 mood and affect normal, dressed appropriately. overweight HEENT: Pupils equal, extraocular movements intact  Respiratory:  Patient's speak in full sentences and does not appear short of breath  Cardiovascular: No lower extremity edema, non tender, no erythema  Skin: Warm dry intact with no signs of infection or rash on extremities or on axial skeleton.  Abdomen: Soft nontender  Neuro: Cranial nerves II through XII are intact, neurovascularly intact in all extremities with 2+ DTRs and 2+ pulses.  Lymph: No lymphadenopathy of posterior or anterior cervical chain or axillae bilaterally.  Gait normal with good balance and coordination.  MSK:  Non tender with full range of motion and good stability and symmetric strength and tone of shoulders, elbows, wrist, hip, knee and ankles bilaterally.  Back Exam:  Inspection: Unremarkable  Motion: Flexion 25 deg, Extension 15 deg, Side Bending to 25 deg bilaterally,  Rotation to 25 deg bilaterally  SLR laying: mild positive on left side XSLR laying: Negative  Palpable tenderness: tenderness in the paraspinal musculature mostly on the left side of the lumbar spine mild muscle spasm noted about the same as previous exam FABER: negative. Sensory change: Gross sensation intact to all lumbar and sacral dermatomes.  Reflexes: 2+ at both patellar tendons, 2+ at achilles tendons, Babinski's downgoing.  Strength at foot  Plantar-flexion: 5/5 Dorsi-flexion: 5/5 Eversion: 5/5 Inversion: 4/5  Leg strength  Quad: 4/5 Hamstring: 4/5 Hip flexor: 4/5 Hip abductors: 4/5 but symmetric Gait unremarkable.     Impression and Recommendations:     This case required medical decision making of moderate complexity.

## 2015-01-16 ENCOUNTER — Encounter: Payer: Self-pay | Admitting: *Deleted

## 2015-01-16 ENCOUNTER — Telehealth: Payer: Self-pay | Admitting: Internal Medicine

## 2015-01-16 NOTE — Telephone Encounter (Signed)
Letter left at front desk. Pt made aware.

## 2015-01-16 NOTE — Telephone Encounter (Signed)
Pt called in stating he needed a updated note for work.   Adrian Ward, he wants to pick it up here

## 2015-02-10 ENCOUNTER — Ambulatory Visit: Payer: BLUE CROSS/BLUE SHIELD | Admitting: Internal Medicine

## 2015-02-11 ENCOUNTER — Ambulatory Visit (INDEPENDENT_AMBULATORY_CARE_PROVIDER_SITE_OTHER): Payer: BLUE CROSS/BLUE SHIELD | Admitting: Internal Medicine

## 2015-02-11 ENCOUNTER — Encounter: Payer: Self-pay | Admitting: Internal Medicine

## 2015-02-11 VITALS — BP 124/88 | HR 104 | Temp 98.4°F | Resp 18 | Ht 67.0 in | Wt 244.2 lb

## 2015-02-11 DIAGNOSIS — M549 Dorsalgia, unspecified: Secondary | ICD-10-CM | POA: Diagnosis not present

## 2015-02-11 DIAGNOSIS — G8929 Other chronic pain: Secondary | ICD-10-CM

## 2015-02-11 DIAGNOSIS — M4806 Spinal stenosis, lumbar region: Secondary | ICD-10-CM

## 2015-02-11 DIAGNOSIS — M48062 Spinal stenosis, lumbar region with neurogenic claudication: Secondary | ICD-10-CM

## 2015-02-11 MED ORDER — FENTANYL 75 MCG/HR TD PT72: 75.0000 ug | MEDICATED_PATCH | TRANSDERMAL | Status: DC

## 2015-02-11 MED ORDER — METHADONE HCL 40 MG PO TBSO: 40.0000 mg | ORAL_TABLET | Freq: Four times a day (QID) | ORAL | Status: DC | PRN

## 2015-02-11 MED ORDER — OXYCODONE HCL 30 MG PO TABS: 30.0000 mg | ORAL_TABLET | ORAL | Status: DC | PRN

## 2015-02-11 NOTE — Patient Instructions (Signed)
Please continue all other medications as before, and refills have been done if requested. - the pain mecications  Please have the pharmacy call with any other refills you may need.  Please keep your appointments with your specialists as you may have planned  Your form should be filled out soon

## 2015-02-11 NOTE — Progress Notes (Signed)
Subjective:    Patient ID: Adrian HippsJoseph T Ward, male    DOB: 10/17/1966, 49 y.o.   MRN: [id number]  HPI  Here to f/u - Pt continues to have recurring LBP, previously recently more on the left, now more on the right for the past wk, no bowel or bladder change, fever, wt loss, but with persistent LE pain bilat, numbness right > left/ and bilat weakness, without falls.  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 Has appt may 3 with pain management, Reqeusts bridge med until then. Can stand only 5-10 minutes due to pain.  Has been seeing sport medicine but unable to progress.  Recent CT angiogram c/w progression spinal stenosis, with intact prior fusion. Past Medical History  Diagnosis Date  . Chronic back pain   . Cervical disc disease   . Hyperlipidemia   . Cervical arthritis   . Headache(784.0)   . Hypertriglyceridemia   . Hypogonadism in male 08/07/2014  . GERD (gastroesophageal reflux disease)    Past Surgical History  Procedure Laterality Date  . Epidural block injection      dr Channing Muttersroy  . Lumbar laminectomy    . Lumbar fusion    . Mouth surgery  1997    reports that he has been smoking.  He has never used smokeless tobacco. He reports that he does not drink alcohol or use illicit drugs. family history includes Alcohol abuse in his father; Breast cancer in his mother; Cancer in his mother; Hypertension in his father; Liver disease in his father. There is no history of Colon cancer, Rectal cancer, Stomach cancer, or Esophageal cancer. Allergies  Allergen Reactions  . Pertussis Vaccines     Had an  allergic reaction to DPT vaccine but never to DT   Current Outpatient Prescriptions on File Prior to Visit  Medication Sig Dispense Refill  . alprazolam (XANAX) 2 MG tablet Take 1 tablet (2 mg total) by mouth daily as needed for sleep. 30 tablet 5  . betamethasone, augmented, (DIPROLENE) 0.05 % lotion Apply topically daily. qpply mixture to dry skin 2 times a day--1/4 betamethasone lotion with 3/4 eucerinelotion     . butalbital-aspirin-caffeine-codeine (FIORINAL WITH CODEINE) 50-325-40-30 MG capsule Take 1 capsule by mouth every 4 (four) hours as needed. 30 capsule 2  . Cholecalciferol (VITAMIN D3) 400 UNITS CAPS Take by mouth daily.      . cyclobenzaprine (FLEXERIL) 5 MG tablet Take 1 tablet (5 mg total) by mouth 3 (three) times daily as needed for muscle spasms. 60 tablet 1  . fenofibrate (TRICOR) 145 MG tablet Take 1 tablet (145 mg total) by mouth daily. 90 tablet 3  . gabapentin (NEURONTIN) 100 MG capsule 100mg  in AM, 100mg  in PM and 300mg  at night 150 capsule 3  . methadone (DOLOPHINE) 10 MG tablet     . ranitidine (ZANTAC) 150 MG capsule Take 150 mg by mouth 2 (two) times daily.    . SYRINGE-NEEDLE, DISP, 3 ML 25G X 1" 3 ML MISC To use with testosterone injections 50 each 1  . testosterone cypionate (DEPOTESTOTERONE CYPIONATE) 200 MG/ML injection Inject  1 mL (200 mg total) into the muscle every 14 (fourteen) days. 10 mL 5  . vitamin E 400 UNIT capsule Take 400 Units by mouth daily.       No current facility-administered medications on file prior to visit.    Review of Systems  All otherwise neg per pt     Objective:   Physical Exam BP 124/88 mmHg  Pulse 104  Temp(Src) 98.4 F (36.9 C) (Oral)  Resp 18  Ht  (1.702 m)  Wt 244 lb 3.2 oz (110.768 kg)  BMI 38.24 kg/m2  SpO2 93% VS noted,  Constitutional: Pt appears in no significant distress HENT: Head: NCAT.  Right Ear: External ear normal.  Left Ear: External ear normal.  Eyes: . Pupils are equal, round, and reactive  to light. Conjunctivae and EOM are normal Neck: Normal range of motion. Neck supple.  Cardiovascular: Normal rate and regular rhythm.   Pulmonary/Chest: Effort normal and breath sounds without rales or wheezing.  Spine: diffuse tender without swelling, rash with also right low lumbar paravertebral tender as well Abd:  Soft, NT, ND, + BS Neurological: Pt is alert. Not confused , motor 4+-5 bilat, sens intact, gait intact Skin: Skin is warm. No rash, no LE edema Psychiatric: Pt behavior is normal. No agitation.     Assessment & Plan:

## 2015-02-15 NOTE — Assessment & Plan Note (Signed)
With progression of lumbar spinal stenosis, for pain med bridge as requested, f/u pain management as planned, f/u sport med as recommended, and LT disability form completed

## 2015-02-15 NOTE — Assessment & Plan Note (Signed)
F/u sport med as planned

## 2015-02-25 ENCOUNTER — Encounter: Payer: Self-pay | Admitting: Internal Medicine

## 2015-02-25 DIAGNOSIS — Z0279 Encounter for issue of other medical certificate: Secondary | ICD-10-CM

## 2015-02-25 NOTE — Progress Notes (Signed)
Patient's form has been faxed to [facility]onya Sp[facility] at [phone] p [phone]. Original sent to scan and a copy mailed to patient.

## 2015-05-01 ENCOUNTER — Telehealth: Payer: Self-pay | Admitting: Internal Medicine

## 2015-05-01 MED ORDER — ALPRAZOLAM 2 MG PO TABS: 2.0000 mg | ORAL_TABLET | Freq: Every day | ORAL | Status: DC | PRN

## 2015-05-01 NOTE — Telephone Encounter (Signed)
Done hardcopy to cynthia 

## 2015-05-01 NOTE — Telephone Encounter (Signed)
rx called in to [facility]. 

## 2015-07-20 ENCOUNTER — Ambulatory Visit (INDEPENDENT_AMBULATORY_CARE_PROVIDER_SITE_OTHER): Payer: BLUE CROSS/BLUE SHIELD | Admitting: Family

## 2015-07-20 ENCOUNTER — Encounter: Payer: Self-pay | Admitting: Family

## 2015-07-20 VITALS — BP 134/72 | HR 110 | Temp 97.9°F | Resp 20 | Ht 67.0 in | Wt 248.0 lb

## 2015-07-20 DIAGNOSIS — M7041 Prepatellar bursitis, right knee: Secondary | ICD-10-CM | POA: Diagnosis not present

## 2015-07-20 DIAGNOSIS — M7040 Prepatellar bursitis, unspecified knee: Secondary | ICD-10-CM | POA: Insufficient documentation

## 2015-07-20 MED ORDER — DOXYCYCLINE HYCLATE 100 MG PO TABS: 100.0000 mg | ORAL_TABLET | Freq: Two times a day (BID) | ORAL | Status: DC

## 2015-07-20 NOTE — Progress Notes (Signed)
Subjective:    Patient ID: Adrian Ward, male    DOB: 29-Apr-1966, 49 y.o.   MRN: [id number]  Chief Complaint  Patient presents with  . Knee Pain    right knee pain, states there is a "sac" in knee and he thinks that the fluid has built up and his knee is infected, warm to the touch, feels like he has body aches and chills    HPI:  Adrian Ward is a 49 y.o. male with a PMH of gastroesophageal reflux, hypogonadism, herniated nucleus pulposus, recurrence nephrolithiasis, hyperlipidemia, tobacco abuse, chronic back pain, and spinal stenosis who presents today for an acute office visit.  This is a new problem. The associated symptom of pain located in his right knee has been going on for about 3 days. Describes a "sac" that has been in his knee that has popped in the past. Describes the current symptoms as warm to the touch and feels like he has had fever and chills. Reports that he had a procedure on his back and fell down to his knee. Modifying treatments include ibuprofen which did seem to help a little.   Allergies  Allergen Reactions  . Pertussis Vaccines     Had an allergic reaction to DPT vaccine but never to DT    Current Outpatient Prescriptions on File Prior to Visit  Medication Sig Dispense Refill  . alprazolam (XANAX) 2 MG tablet Take 1 tablet (2 mg total) by mouth daily as needed for sleep. 30 tablet 5  . betamethasone, augmented, (DIPROLENE) 0.05 % lotion Apply topically daily. qpply mixture to dry skin 2 times a day--1/4 betamethasone lotion with 3/4 eucerinelotion     . butalbital-aspirin-caffeine-codeine (FIORINAL WITH CODEINE) 50-325-40-30 MG capsule Take 1 capsule by mouth every 4 (four) hours as needed. 30 capsule 2  . Cholecalciferol (VITAMIN D3) 400 UNITS CAPS Take by mouth daily.      . fenofibrate (TRICOR) 145 MG tablet Take 1 tablet (145 mg total) by mouth daily. 90 tablet 3  . gabapentin (NEURONTIN) 100 MG capsule 100mg  in AM, 100mg  in PM and 300mg  at night  150 capsule 3  . methadone (DOLOPHINE) 10 MG tablet     . oxycodone (ROXICODONE) 30 MG immediate release tablet Take 1 tablet (30 mg total) by mouth every 4 (four) hours as needed. 180 tablet 0  . ranitidine (ZANTAC) 150 MG capsule Take 150 mg by mouth 2 (two) times daily.    . SYRINGE-NEEDLE, DISP, 3 ML 25G X 1" 3 ML MISC To use with testosterone injections 50 each 1  . testosterone cypionate (DEPOTESTOTERONE CYPIONATE) 200 MG/ML injection Inject 1 mL (200 mg total) into the muscle every 14 (fourteen) days. 10 mL 5  . vitamin E 400 UNIT capsule Take 400 Units by mouth daily.       No current facility-administered medications on file prior to visit.     Review of Systems  Constitutional: Negative for fever and chills.  Musculoskeletal:       Positive for right knee pain.      Objective:    BP 134/72 mmHg  Pulse 110  Temp(Src) 97.9 F (36.6 C) (Oral)  Resp 20  Ht 5\' 7"  (1.702 m)  Wt 248 lb (112.492 kg)  BMI 38.83 kg/m2  SpO2 95% Nursing note and vital signs reviewed.  Physical Exam  Constitutional: He is oriented to person, place, and time. He appears well-developed and well-nourished. No distress.  Cardiovascular: Normal rate, regular rhythm, normal heart  sounds and intact distal pulses.   Pulmonary/Chest: Effort normal and breath sounds normal.  Musculoskeletal:  Right knee - obvious edema with some deformity noted. Palpable tenderness anterior to the patella consistent with prepatellar bursa. Increased temperature noted. Range of motion is complete that discomfort. Ligamentous or meniscal testing are negative. Distal pulses and sensation are intact and appropriate.  Neurological: He is alert and oriented to person, place, and time.  Skin: Skin is warm and dry.  Psychiatric: He has a normal mood and affect. His behavior is normal. Judgment and thought content normal.       Assessment & Plan:   Problem List Items Addressed This Visit      Musculoskeletal and Integument     Prepatellar bursitis - Primary    Symptoms and exam consistent with prepatellar bursitis most likely related to a fall on his right knee. Some concern for infection, treat empirically with doxycycline. Ice 2-3 times per day as needed and compression. Home exercise therapy initiated. If symptoms worsen or fail bursa sac may need to be drained.      Relevant Medications   doxycycline (VIBRA-TABS) 100 MG tablet

## 2015-07-20 NOTE — Patient Instructions (Signed)
Thank you for choosing [facility].  Summary/Instructions:  Your prescription(s) have been submitted to your pharmacy or been printed and provided for you. Please take as directed and contact our office if you believe you are having problem(s) with the medication(s) or have any questions.  If your symptoms worsen or fail to improve, please contact our office for further instruction, or in case of emergency go directly to the emergency room at the closest medical facility.   Knee Exercises EXERCISES RANGE OF MOTION (ROM) AND STRETCHING EXERCISES These exercises may help you when beginning to rehabilitate your injury. Your symptoms may resolve with or without further involvement from your physician, physical therapist, or athletic trainer. While completing these exercises, remember:   Restoring tissue flexibility helps normal motion to return to the joints. This allows healthier, less painful movement and activity.  An effective stretch should be held for at least 30 seconds.  A stretch should never be painful. You should only feel a gentle lengthening or release in the stretched tissue. STRETCH - Knee Extension, Prone  Lie on your stomach on a firm surface, such as a bed or countertop. Place your right / left knee and leg just beyond the edge of the surface. You may wish to place a towel under the far end of your right / left thigh for comfort.  Relax your leg muscles and allow gravity to straighten your knee. Your clinician may advise you to add an ankle weight if more resistance is helpful for you.  You should feel a stretch in the back of your right / left knee. Hold this position for __________ seconds. Repeat __________ times. Complete this stretch __________ times per day. * Your physician, physical therapist, or athletic trainer may ask you to add ankle weight to enhance your stretch.  RANGE OF MOTION - Knee Flexion, Active  Lie on your back with both knees straight. (If  this causes back discomfort, bend your opposite knee, placing your foot flat on the floor.)  Slowly slide your heel back toward your buttocks until you feel a gentle stretch in the front of your knee or thigh.  Hold for __________ seconds. Slowly slide your heel back to the starting position. Repeat __________ times. Complete this exercise __________ times per day.  STRETCH - Quadriceps, Prone   Lie on your stomach on a firm surface, such as a bed or padded floor.  Bend your right / left knee and grasp your ankle. If you are unable to reach your ankle or pant leg, use a belt around your foot to lengthen your reach.  Gently pull your heel toward your buttocks. Your knee should not slide out to the side. You should feel a stretch in the front of your thigh and/or knee.  Hold this position for __________ seconds. Repeat __________ times. Complete this stretch __________ times per day.  STRETCH - Hamstrings, Supine   Lie on your back. Loop a belt or towel over the ball of your right / left foot.  Straighten your right / left knee and slowly pull on the belt to raise your leg. Do not allow the right / left knee to bend. Keep your opposite leg flat on the floor.  Raise the leg until you feel a gentle stretch behind your right / left knee or thigh. Hold this position for __________ seconds. Repeat __________ times. Complete this stretch __________ times per day.  STRENGTHENING EXERCISES These exercises may help you when beginning to rehabilitate your injury. They  may resolve your symptoms with or without further involvement from your physician, physical therapist, or athletic trainer. While completing these exercises, remember:   Muscles can gain both the endurance and the strength needed for everyday activities through controlled exercises.  Complete these exercises as instructed by your physician, physical therapist, or athletic trainer. Progress the resistance and repetitions only as  guided.  You may experience muscle soreness or fatigue, but the pain or discomfort you are trying to eliminate should never worsen during these exercises. If this pain does worsen, stop and make certain you are following the directions exactly. If the pain is still present after adjustments, discontinue the exercise until you can discuss the trouble with your clinician. STRENGTH - Quadriceps, Isometrics  Lie on your back with your right / left leg extended and your opposite knee bent.  Gradually tense the muscles in the front of your right / left thigh. You should see either your knee cap slide up toward your hip or increased dimpling just above the knee. This motion will push the back of the knee down toward the floor/mat/bed on which you are lying.  Hold the muscle as tight as you can without increasing your pain for __________ seconds.  Relax the muscles slowly and completely in between each repetition. Repeat __________ times. Complete this exercise __________ times per day.  STRENGTH - Quadriceps, Short Arcs   Lie on your back. Place a __________ inch towel roll under your knee so that the knee slightly bends.  Raise only your lower leg by tightening the muscles in the front of your thigh. Do not allow your thigh to rise.  Hold this position for __________ seconds. Repeat __________ times. Complete this exercise __________ times per day.  OPTIONAL ANKLE WEIGHTS: Begin with ____________________, but DO NOT exceed ____________________. Increase in 1 pound/0.5 kilogram increments.  STRENGTH - Quadriceps, Straight Leg Raises  Quality counts! Watch for signs that the quadriceps muscle is working to insure you are strengthening the correct muscles and not "cheating" by substituting with healthier muscles.  Lay on your back with your right / left leg extended and your opposite knee bent.  Tense the muscles in the front of your right / left thigh. You should see either your knee cap slide up  or increased dimpling just above the knee. Your thigh may even quiver.  Tighten these muscles even more and raise your leg 4 to 6 inches off the floor. Hold for __________ seconds.  Keeping these muscles tense, lower your leg.  Relax the muscles slowly and completely in between each repetition. Repeat __________ times. Complete this exercise __________ times per day.  STRENGTH - Hamstring, Curls  Lay on your stomach with your legs extended. (If you lay on a bed, your feet may hang over the edge.)  Tighten the muscles in the back of your thigh to bend your right / left knee up to 90 degrees. Keep your hips flat on the bed/floor.  Hold this position for __________ seconds.  Slowly lower your leg back to the starting position. Repeat __________ times. Complete this exercise __________ times per day.  OPTIONAL ANKLE WEIGHTS: Begin with ____________________, but DO NOT exceed ____________________. Increase in 1 pound/0.5 kilogram increments.  STRENGTH - Quadriceps, Squats  Stand in a door frame so that your feet and knees are in line with the frame.  Use your hands for balance, not support, on the frame.  Slowly lower your weight, bending at the hips and knees. Keep your  lower legs upright so that they are parallel with the door frame. Squat only within the range that does not increase your knee pain. Never let your hips drop below your knees.  Slowly return upright, pushing with your legs, not pulling with your hands. Repeat __________ times. Complete this exercise __________ times per day.  STRENGTH - Quadriceps, Wall Slides  Follow guidelines for form closely. Increased knee pain often results from poorly placed feet or knees.  Lean against a smooth wall or door and walk your feet out 18-24 inches. Place your feet hip-width apart.  Slowly slide down the wall or door until your knees bend __________ degrees.* Keep your knees over your heels, not your toes, and in line with your hips,  not falling to either side.  Hold for __________ seconds. Stand up to rest for __________ seconds in between each repetition. Repeat __________ times. Complete this exercise __________ times per day. * Your physician, physical therapist, or athletic trainer will alter this angle based on your symptoms and progress. Document Released: 09/07/2005 Document Revised: 03/10/2014 Document Reviewed: 02/05/2009 [facility] Patient Information 2015 [place], [place]. This information is not intended to replace advice given to you by your health care provider. Make sure you discuss any questions you have with your health care provider.

## 2015-07-20 NOTE — Assessment & Plan Note (Signed)
Symptoms and exam consistent with prepatellar bursitis most likely related to a fall on his right knee. Some concern for infection, treat empirically with doxycycline. Ice 2-3 times per day as needed and compression. Home exercise therapy initiated. If symptoms worsen or fail bursa sac may need to be drained.

## 2015-07-20 NOTE — Progress Notes (Signed)
Pre visit review using our clinic review tool, if applicable. No additional management support is needed unless otherwise documented below in the visit note. 

## 2015-11-23 ENCOUNTER — Telehealth: Payer: Self-pay | Admitting: Internal Medicine

## 2015-11-23 MED ORDER — ALPRAZOLAM 2 MG PO TABS: 2.0000 mg | ORAL_TABLET | Freq: Every day | ORAL | Status: DC | PRN

## 2015-11-23 NOTE — Telephone Encounter (Signed)
Please advise, thanks.

## 2015-11-23 NOTE — Telephone Encounter (Signed)
Pt callled in and needs a refill on alprazolam Adrian Ward(XANAX) 2 MG tablet [[id number]][[id number]]

## 2015-11-23 NOTE — Telephone Encounter (Signed)
Done hardcopy to Dahlia  

## 2015-11-24 ENCOUNTER — Telehealth: Payer: Self-pay

## 2015-11-24 NOTE — Telephone Encounter (Signed)
Medication xanax printed signed and faxed

## 2015-12-17 ENCOUNTER — Other Ambulatory Visit (INDEPENDENT_AMBULATORY_CARE_PROVIDER_SITE_OTHER): Payer: PRIVATE HEALTH INSURANCE

## 2015-12-17 ENCOUNTER — Encounter: Payer: Self-pay | Admitting: Internal Medicine

## 2015-12-17 ENCOUNTER — Ambulatory Visit (INDEPENDENT_AMBULATORY_CARE_PROVIDER_SITE_OTHER): Payer: PRIVATE HEALTH INSURANCE | Admitting: Internal Medicine

## 2015-12-17 VITALS — BP 122/80 | HR 95 | Temp 98.3°F | Resp 20 | Ht 67.0 in | Wt 255.0 lb

## 2015-12-17 DIAGNOSIS — Z0000 Encounter for general adult medical examination without abnormal findings: Secondary | ICD-10-CM

## 2015-12-17 DIAGNOSIS — G8929 Other chronic pain: Secondary | ICD-10-CM

## 2015-12-17 DIAGNOSIS — R7989 Other specified abnormal findings of blood chemistry: Secondary | ICD-10-CM

## 2015-12-17 DIAGNOSIS — M549 Dorsalgia, unspecified: Secondary | ICD-10-CM

## 2015-12-17 LAB — HEPATIC FUNCTION PANEL
ALBUMIN: 4.1 g/dL (ref 3.5–5.2)
ALT: 24 U/L (ref 0–53)
AST: 20 U/L (ref 0–37)
Alkaline Phosphatase: 64 U/L (ref 39–117)
Bilirubin, Direct: 0 mg/dL (ref 0.0–0.3)
Total Bilirubin: 0.2 mg/dL (ref 0.2–1.2)
Total Protein: 6.9 g/dL (ref 6.0–8.3)

## 2015-12-17 LAB — CBC WITH DIFFERENTIAL/PLATELET
BASOS ABS: 0 10*3/uL (ref 0.0–0.1)
Basophils Relative: 0.3 % (ref 0.0–3.0)
Eosinophils Absolute: 0.2 10*3/uL (ref 0.0–0.7)
Eosinophils Relative: 2.7 % (ref 0.0–5.0)
HCT: 41.9 % (ref 39.0–52.0)
Hemoglobin: 13.7 g/dL (ref 13.0–17.0)
Lymphocytes Relative: 36.8 % (ref 12.0–46.0)
Lymphs Abs: 2.9 10*3/uL (ref 0.7–4.0)
MCHC: 32.7 g/dL (ref 30.0–36.0)
MCV: 87.2 fl (ref 78.0–100.0)
MONO ABS: 0.9 10*3/uL (ref 0.1–1.0)
MONOS PCT: 11.5 % (ref 3.0–12.0)
NEUTROS PCT: 48.7 % (ref 43.0–77.0)
Neutro Abs: 3.8 10*3/uL (ref 1.4–7.7)
PLATELETS: 207 10*3/uL (ref 150.0–400.0)
RBC: 4.8 Mil/uL (ref 4.22–5.81)
RDW: 13.7 % (ref 11.5–15.5)
WBC: 7.8 10*3/uL (ref 4.0–10.5)

## 2015-12-17 LAB — BASIC METABOLIC PANEL
BUN: 14 mg/dL (ref 6–23)
CALCIUM: 9.5 mg/dL (ref 8.4–10.5)
CHLORIDE: 101 meq/L (ref 96–112)
CO2: 34 mEq/L — ABNORMAL HIGH (ref 19–32)
Creatinine, Ser: 0.89 mg/dL (ref 0.40–1.50)
GFR: 95.53 mL/min (ref >60.00)
GLUCOSE: 106 mg/dL — AB (ref 70–99)
Potassium: 4.3 mEq/L (ref 3.5–5.1)
Sodium: 139 mEq/L (ref 135–145)

## 2015-12-17 LAB — URINALYSIS, ROUTINE W REFLEX MICROSCOPIC
BILIRUBIN URINE: NEGATIVE
Ketones, ur: NEGATIVE
LEUKOCYTES UA: NEGATIVE
Nitrite: NEGATIVE
PH: 6 (ref 5.0–8.0)
SPECIFIC GRAVITY, URINE: 1.025 (ref 1.000–1.030)
Total Protein, Urine: NEGATIVE
URINE GLUCOSE: NEGATIVE
UROBILINOGEN UA: 0.2 (ref 0.0–1.0)
WBC, UA: NONE SEEN (ref 0–?)

## 2015-12-17 LAB — LIPID PANEL
CHOLESTEROL: 179 mg/dL (ref 0–200)
HDL: 23.8 mg/dL — ABNORMAL LOW (ref >39.00)
Total CHOL/HDL Ratio: 8
Triglycerides: 740 mg/dL — ABNORMAL HIGH (ref 0.0–149.0)

## 2015-12-17 LAB — PSA: PSA: 0.13 ng/mL (ref 0.10–4.00)

## 2015-12-17 LAB — TSH: TSH: 2.22 u[IU]/mL (ref 0.35–4.50)

## 2015-12-17 LAB — LDL CHOLESTEROL, DIRECT: LDL DIRECT: 55 mg/dL

## 2015-12-17 NOTE — Progress Notes (Signed)
Pre visit review using our clinic review tool, if applicable. No additional management support is needed unless otherwise documented below in the visit note. 

## 2015-12-17 NOTE — Progress Notes (Signed)
Subjective:    Patient ID: Adrian Ward, male    DOB: 24-Jul-1966, 50 y.o.   MRN: [id number]  HPI  Here for wellness and f/u;  Overall doing ok;  Pt denies Chest pain, worsening SOB, DOE, wheezing, orthopnea, PND, worsening LE edema, palpitations, dizziness or syncope.  Pt denies neurological change such as new headache, facial or extremity weakness.  Pt denies polydipsia, polyuria, or low sugar symptoms. Pt states overall good compliance with treatment and medications, good tolerability, and has been trying to follow appropriate diet.  Pt denies worsening depressive symptoms, suicidal ideation or panic. No fever, night sweats, wt loss, loss of appetite, or other constitutional symptoms.  Pt states good ability with ADL's, has low fall risk, home safety reviewed and adequate, no other significant changes in hearing or vision, and only occasionally active with exercise.  Has seen pain management yesterday, Pt continues to have recurring LBP without change in severity, bowel or bladder change, fever, wt loss,  worsening LE pain/numbness/weakness, gait change or falls, but did have an episode of flare of sharp pain on the right lower back to going up stairs; also had swelling to left knee now improved. Still out of work.  Pain still worse to bending over, though to have :slippage" and may have slipped ray cages or possibly the next level has deteriorated as well.  Pain worse to even doing the dishes at the sink for a few minutes.  Still takes 4 hrs to get ready for an appt since too much activity to dressing, etc causes too much pain. Shopping remains difficult, can hardly get around [employer]. Also with sitting can get numbness to the right foot.  Did have one fall recent going up some stairs.  Denies urinary symptoms such as dysuria, frequency, urgency, flank pain, hematuria or n/v, fever, chills. Denies worsening reflux, abd pain, dysphagia, n/v, bowel change or blood. Past Medical History  Diagnosis Date    . Chronic back pain   . Cervical disc disease   . Hyperlipidemia   . Cervical arthritis (HCC)   . Headache(784.0)   . Hypertriglyceridemia   . Hypogonadism in male 08/07/2014  . GERD (gastroesophageal reflux disease)    Past Surgical History  Procedure Laterality Date  . Epidural block injection      dr Channing Mutters  . Lumbar laminectomy    . Lumbar fusion    . Mouth surgery  1997    reports that he has been smoking.  He has never used smokeless tobacco. He reports that he does not drink alcohol or use illicit drugs. family history includes Alcohol abuse in his father; Breast cancer in his mother; Cancer in his mother; Hypertension in his father; Liver disease in his father. There is no history of Colon cancer, Rectal cancer, Stomach cancer, or Esophageal cancer. Allergies  Allergen Reactions  . Pertussis Vaccines     Had an allergic reaction to DPT vaccine but never to DT   Current Outpatient Prescriptions on File Prior to Visit  Medication Sig Dispense Refill  . alprazolam (XANAX) 2 MG tablet Take 1 tablet (2 mg total) by mouth daily as needed for sleep. 30 tablet 5  . betamethasone, augmented, (DIPROLENE) 0.05 % lotion Apply topically daily. qpply mixture to dry skin 2 times a day--1/4 betamethasone lotion with 3/4 eucerinelotion     . butalbital-aspirin-caffeine-codeine (FIORINAL WITH CODEINE) 50-325-40-30 MG capsule Take 1 capsule by mouth every 4 (four) hours as needed. 30 capsule 2  . Cholecalciferol (VITAMIN  D3) 400 UNITS CAPS Take by mouth daily.      Marland Kitchen doxycycline (VIBRA-TABS) 100 MG tablet Take 1 tablet (100 mg total) by mouth 2 (two) times daily. 20 tablet 0  . fenofibrate (TRICOR) 145 MG tablet Take 1 tablet (145 mg total) by mouth daily. 90 tablet 3  . gabapentin (NEURONTIN) 100 MG capsule  in AM,  in PM and  at night 150 capsule 3  . methadone (DOLOPHINE) 10 MG tablet     . oxycodone (ROXICODONE) 30 MG immediate release tablet Take 1 tablet (30 mg total) by  mouth every 4 (four) hours as needed. 180 tablet 0  . ranitidine (ZANTAC) 150 MG capsule Take 150 mg by mouth 2 (two) times daily.    . SYRINGE-NEEDLE, DISP, 3 ML 25G X 1" 3 ML MISC To use with testosterone injections 50 each 1  . testosterone cypionate (DEPOTESTOTERONE CYPIONATE) 200 MG/ML injection Inject 1 mL (200 mg total) into the muscle every 14 (fourteen) days. 10 mL 5  . vitamin E 400 UNIT capsule Take 400 Units by mouth daily.       No current facility-administered medications on file prior to visit.    Review of Systems Constitutional: Negative for increased diaphoresis, other activity, appetite or siginficant weight change other than noted HENT: Negative for worsening hearing loss, ear pain, facial swelling, mouth sores and neck stiffness.   Eyes: Negative for other worsening pain, redness or visual disturbance.  Respiratory: Negative for shortness of breath and wheezing  Cardiovascular: Negative for chest pain and palpitations.  Gastrointestinal: Negative for diarrhea, blood in stool, abdominal distention or other pain Genitourinary: Negative for hematuria, flank pain or change in urine volume.  Musculoskeletal: Negative for myalgias or other joint complaints.  Skin: Negative for color change and wound or drainage.  Neurological: Negative for syncope and numbness. other than noted Hematological: Negative for adenopathy. or other swelling Psychiatric/Behavioral: Negative for hallucinations, SI, self-injury, decreased concentration or other worsening agitation.      Objective:   Physical Exam BP 122/80 mmHg  Pulse 95  Temp(Src) 98.3 F (36.8 C) (Oral)  Resp 20  Ht  (1.702 m)  Wt 255 lb (115.667 kg)  BMI 39.93 kg/m2  SpO2 94% VS noted,  Constitutional: Pt is oriented to person, place, and time. Appears well-developed and well-nourished, in no significant distress Head: Normocephalic and atraumatic.  Right Ear: External ear normal.  Left Ear: External ear normal.    Nose: Nose normal.  Mouth/Throat: Oropharynx is clear and moist.  Eyes: Conjunctivae and EOM are normal. Pupils are equal, round, and reactive to light.  Neck: Normal range of motion. Neck supple. No JVD present. No tracheal deviation present or significant neck LA or mass Cardiovascular: Normal rate, regular rhythm, normal heart sounds and intact distal pulses.   Pulmonary/Chest: Effort normal and breath sounds without rales or wheezing  Abdominal: Soft. Bowel sounds are normal. NT. No HSM  Musculoskeletal: Normal range of motion. Exhibits no edema.  Lymphadenopathy:  Has no cervical adenopathy.  Neurological: Pt is alert and oriented to person, place, and time. Pt has normal reflexes. No cranial nerve deficit. Motor grossly intact Spine: diffuse tender, also chronic MSK spasm left > right lumbar as well Skin: Skin is warm and dry. No rash noted.  Psychiatric:  Has normal mood and affect. Behavior is normal.         Assessment & Plan:

## 2015-12-17 NOTE — Patient Instructions (Signed)

## 2015-12-17 NOTE — Assessment & Plan Note (Addendum)

## 2015-12-17 NOTE — Assessment & Plan Note (Signed)
Pt has a form to be filled out, will drop by later

## 2016-01-29 ENCOUNTER — Telehealth: Payer: Self-pay | Admitting: Internal Medicine

## 2016-01-29 NOTE — Telephone Encounter (Signed)
Rec'd from [facility] forward 5 pages to Dr. Jonny RuizJohn

## 2016-06-17 ENCOUNTER — Other Ambulatory Visit: Payer: Self-pay | Admitting: Internal Medicine

## 2016-06-17 NOTE — Telephone Encounter (Signed)
Refill sent to pharmacy.   

## 2016-06-17 NOTE — Telephone Encounter (Signed)
Done hardcopy to Corinne  

## 2016-06-30 ENCOUNTER — Encounter: Payer: Self-pay | Admitting: Internal Medicine

## 2016-06-30 ENCOUNTER — Ambulatory Visit (INDEPENDENT_AMBULATORY_CARE_PROVIDER_SITE_OTHER): Payer: Self-pay | Admitting: Internal Medicine

## 2016-06-30 ENCOUNTER — Other Ambulatory Visit: Payer: Self-pay

## 2016-06-30 VITALS — BP 140/82 | HR 111 | Temp 98.6°F | Resp 20 | Wt 246.0 lb

## 2016-06-30 DIAGNOSIS — R300 Dysuria: Secondary | ICD-10-CM

## 2016-06-30 DIAGNOSIS — M48062 Spinal stenosis, lumbar region with neurogenic claudication: Secondary | ICD-10-CM

## 2016-06-30 DIAGNOSIS — M549 Dorsalgia, unspecified: Secondary | ICD-10-CM

## 2016-06-30 DIAGNOSIS — M4806 Spinal stenosis, lumbar region: Secondary | ICD-10-CM

## 2016-06-30 DIAGNOSIS — G8929 Other chronic pain: Secondary | ICD-10-CM

## 2016-06-30 MED ORDER — OXYCODONE HCL 30 MG PO TABS: 30.0000 mg | ORAL_TABLET | ORAL | 0 refills | Status: DC | PRN

## 2016-06-30 MED ORDER — OXYCODONE HCL 30 MG PO TABS: 30.0000 mg | ORAL_TABLET | Freq: Four times a day (QID) | ORAL | 0 refills | Status: DC | PRN

## 2016-06-30 MED ORDER — METHADONE HCL 10 MG PO TABS: 10.0000 mg | ORAL_TABLET | Freq: Four times a day (QID) | ORAL | 0 refills | Status: DC

## 2016-06-30 NOTE — Assessment & Plan Note (Signed)
For f/u pain management

## 2016-06-30 NOTE — Patient Instructions (Addendum)
Please continue all other medications as before, and refills have been done if requested - the pain medications  Please remember I cannot provide further pain prescriptions beyond the 3 mo medications provided today  Please have the pharmacy call with any other refills you may need.  Please continue your efforts at being more active, low cholesterol diet, and weight control. . Please keep your appointments with your specialists as you may have planned  You will be contacted regarding the referral for: MRI , and Dr Channing Muttersoy, and pain management

## 2016-06-30 NOTE — Progress Notes (Signed)
Pre visit review using our clinic review tool, if applicable. No additional management support is needed unless otherwise documented below in the visit note. 

## 2016-06-30 NOTE — Assessment & Plan Note (Signed)
For f/u MRI and Dr Roy/NS after sept 28 insurance start per pt request

## 2016-06-30 NOTE — Progress Notes (Signed)
Subjective:    Patient ID: Adrian HippsJoseph T Kennerson, male    DOB: Jan 02, 1966, 50 y.o.   MRN: [id number]  HPI  Here for f/u visit with sister, no insurance today but sister heling him here today for f/u back pain, trying to plan ahead as he wil have insurance sept 28 and  Needs referral to Dr Channing Muttersoy (original lumbar neurosurgeon) as well as new pain clinic management as prior has completely left the area.  Now at risk for withdrawal as no access to chronic narcotic as before.  Dr Channing Muttersoy wants MRi prior to being seen, but has to be after sept 28 due to insurance.  Pt continues to have recurring LBP without essential change in severity,and no bowel or bladder change, fever, wt loss,  worsening LE pain/numbness/weakness, gait change or falls. Some pain has radiated to the left flank, has hx of renal stones, but Denies urinary symptoms such as dysuria, frequency, urgency, flank pain, hematuria or n/v, fever, chills.  Past Medical History:  Diagnosis Date  . Cervical arthritis (HCC)   . Cervical disc disease   . Chronic back pain   . GERD (gastroesophageal reflux disease)   . Headache(784.0)   . Hyperlipidemia   . Hypertriglyceridemia   . Hypogonadism in male 08/07/2014   Past Surgical History:  Procedure Laterality Date  . EPIDURAL BLOCK INJECTION     dr Channing Muttersroy  . LUMBAR FUSION    . LUMBAR LAMINECTOMY    . MOUTH SURGERY  1997    reports that he has been smoking.  He has been smoking about 0.50 packs per day. He has never used smokeless tobacco. He reports that he does not drink alcohol or use drugs. family history includes Alcohol abuse in his father; Breast cancer in his mother; Cancer in his mother; Hypertension in his father; Liver disease in his father. Allergies  Allergen Reactions  . Pertussis Vaccines     Had an allergic reaction to DPT vaccine but never to DT   Current Outpatient Prescriptions on File Prior to Visit  Medication Sig Dispense Refill  . alprazolam (XANAX) 2 MG tablet TAKE 1 TABLET  BY MOUTH EVERY DAY AS NEEDED FOR SLEEP 30 tablet 5  . betamethasone, augmented, (DIPROLENE) 0.05 % lotion Apply topically daily. qpply mixture to dry skin 2 times a day--1/4 betamethasone lotion with 3/4 eucerinelotion     . butalbital-aspirin-caffeine-codeine (FIORINAL WITH CODEINE) 50-325-40-30 MG capsule Take 1 capsule by mouth every 4 (four) hours as needed. 30 capsule 2  . Cholecalciferol (VITAMIN D3) 400 UNITS CAPS Take by mouth daily.      Marland Kitchen. gabapentin (NEURONTIN) 100 MG capsule 100mg  in AM, 100mg  in PM and 300mg  at night 150 capsule 3  . ranitidine (ZANTAC) 150 MG capsule Take 150 mg by mouth 2 (two) times daily.    . vitamin E 400 UNIT capsule Take 400 Units by mouth daily.       No current facility-administered medications on file prior to visit.    Review of Systems  Constitutional: Negative for unusual diaphoresis or night sweats HENT: Negative for ear swelling or discharge Eyes: Negative for worsening visual haziness  Respiratory: Negative for choking and stridor.   Gastrointestinal: Negative for distension or worsening eructation Genitourinary: Negative for retention or change in urine volume.  Musculoskeletal: Negative for other MSK pain or swelling Skin: Negative for color change and worsening wound Neurological: Negative for tremors and numbness other than noted  Psychiatric/Behavioral: Negative for decreased concentration or agitation other  than above       Objective:   Physical Exam BP 140/82   Pulse (!) 111   Temp 98.6 F (37 C) (Oral)   Resp 20   Wt 246 lb (111.6 kg)   SpO2 95%   BMI 38.53 kg/m  VS noted,  Constitutional: Pt appears in no apparent distress HENT: Head: NCAT.  Right Ear: External ear normal.  Left Ear: External ear normal.  Eyes: . Pupils are equal, round, and reactive to light. Conjunctivae and EOM are normal Neck: Normal range of motion. Neck supple.  Cardiovascular: Normal rate and regular rhythm.   Pulmonary/Chest: Effort normal and  breath sounds without rales or wheezing.  Abd:  Soft, NT, ND, + BS Spine diffuse lowest lumbar tender and left lumbar paravertebral Neurological: Pt is alert. Not confused , motor grossly intact except of r4+5 LLE - ? Due to pain Skin: Skin is warm. No rash, no LE edema Psychiatric: Pt behavior is normal. No agitation.      Assessment & Plan:

## 2016-06-30 NOTE — Addendum Note (Signed)
Addended by: Etheleen MayhewOX, HEATHER C on: 06/30/2016 10:29 AM   Modules accepted: Orders

## 2016-07-02 LAB — CULTURE, URINE COMPREHENSIVE: ORGANISM ID, BACTERIA: NO GROWTH

## 2016-08-08 ENCOUNTER — Ambulatory Visit
Admission: RE | Admit: 2016-08-08 | Discharge: 2016-08-08 | Disposition: A | Discharge status: Home / Self Care | Payer: BLUE CROSS/BLUE SHIELD | Source: Ambulatory Visit | Attending: Internal Medicine | Admitting: Internal Medicine

## 2016-08-08 ENCOUNTER — Encounter: Payer: Self-pay | Admitting: Internal Medicine

## 2016-08-08 DIAGNOSIS — M549 Dorsalgia, unspecified: Principal | ICD-10-CM

## 2016-08-08 DIAGNOSIS — M48062 Spinal stenosis, lumbar region with neurogenic claudication: Secondary | ICD-10-CM

## 2016-08-08 DIAGNOSIS — G8929 Other chronic pain: Secondary | ICD-10-CM

## 2016-10-19 ENCOUNTER — Telehealth: Payer: Self-pay | Admitting: Internal Medicine

## 2016-10-19 NOTE — Telephone Encounter (Signed)
Error

## 2016-10-19 NOTE — Telephone Encounter (Signed)
Patient came in today checking on Pain Management Rehab referral.  Patient states he spoke to his neurologist and neurologist states he needs to be in a pain management clinic soon.  Patient states he does have insurance now through [employer].  Will enter insurance into system.  Patient would like to be referred to [facility] [id number] [place] [id number].  UJ#[phone] or to [facility] [address] [place] [id number] [phone] and [phone]. Dr. Gerilyn Pilgrimoonquah.  Or he would like to be sent to Dr. Robynn PaneTool.  He use to see this pain specialist.  Dr. Robynn PaneTool [place] in [place] at [facility].  PH# [phone].

## 2016-10-20 NOTE — Telephone Encounter (Signed)
Pt scheduled and aware of appt date, time and location.

## 2016-10-21 ENCOUNTER — Ambulatory Visit (INDEPENDENT_AMBULATORY_CARE_PROVIDER_SITE_OTHER): Payer: BLUE CROSS/BLUE SHIELD | Admitting: Internal Medicine

## 2016-10-21 ENCOUNTER — Encounter: Payer: Self-pay | Admitting: Internal Medicine

## 2016-10-21 DIAGNOSIS — J018 Other acute sinusitis: Secondary | ICD-10-CM

## 2016-10-21 DIAGNOSIS — M5440 Lumbago with sciatica, unspecified side: Secondary | ICD-10-CM | POA: Diagnosis not present

## 2016-10-21 DIAGNOSIS — J309 Allergic rhinitis, unspecified: Secondary | ICD-10-CM

## 2016-10-21 DIAGNOSIS — G8929 Other chronic pain: Secondary | ICD-10-CM

## 2016-10-21 MED ORDER — METHADONE HCL 10 MG PO TABS: 10.0000 mg | ORAL_TABLET | Freq: Four times a day (QID) | ORAL | 0 refills | Status: DC

## 2016-10-21 MED ORDER — GABAPENTIN 400 MG PO CAPS: 400.0000 mg | ORAL_CAPSULE | Freq: Three times a day (TID) | ORAL | 5 refills | Status: DC

## 2016-10-21 MED ORDER — TRIAMCINOLONE ACETONIDE 55 MCG/ACT NA AERO: 2.0000 | INHALATION_SPRAY | Freq: Every day | NASAL | 12 refills | Status: DC

## 2016-10-21 MED ORDER — AZITHROMYCIN 250 MG PO TABS: ORAL_TABLET | ORAL | 1 refills | Status: DC

## 2016-10-21 MED ORDER — OXYCODONE HCL 30 MG PO TABS: 30.0000 mg | ORAL_TABLET | Freq: Four times a day (QID) | ORAL | 0 refills | Status: DC | PRN

## 2016-10-21 NOTE — Progress Notes (Signed)
Subjective:    Patient ID: Adrian HippsJoseph T Ward, male    DOB: [birth year], 50 y.o.   MRN: [id number]  HPI  Here to f/u, ears ringing and wind makes worse today;  Has seen Dr Roy/NS and has appt for jan 10 planned;  Has appt with pain management jan 15, but will run out of meds prior to then.  Pt continues to have recurring LBP without change in severity, bowel or bladder change, fever, wt loss, or falls.  Here with 2-3 days acute onset fever, facial pain, pressure, headache, general weakness and malaise, and greenish d/c, with mild ST and cough, but pt denies chest pain, wheezing, increased sob or doe, orthopnea, PND, increased LE swelling, palpitations, dizziness or syncope. Pt denies new neurological symptoms such as new headache, or facial or extremity weakness or numbness  Does also have several wks ongoing nasal allergy symptoms with clearish congestion, itch and sneezing, without fever, pain, ST, cough, swelling or wheezing. Pt denies polydipsia, polyuria.  Pt states overall good compliance with meds.  Past Medical History:  Diagnosis Date  . Cervical arthritis (HCC)   . Cervical disc disease   . Chronic back pain   . GERD (gastroesophageal reflux disease)   . Headache(784.0)   . Hyperlipidemia   . Hypertriglyceridemia   . Hypogonadism in male 08/07/2014   Past Surgical History:  Procedure Laterality Date  . EPIDURAL BLOCK INJECTION     dr Channing Muttersroy  . LUMBAR FUSION    . LUMBAR LAMINECTOMY    . MOUTH SURGERY  1997    reports that he has been smoking.  He has been smoking about 0.50 packs per day. He has never used smokeless tobacco. He reports that he does not drink alcohol or use drugs. family history includes Alcohol abuse in his father; Breast cancer in his mother; Cancer in his mother; Hypertension in his father; Liver disease in his father. Allergies  Allergen Reactions  . Pertussis Vaccines     Had an allergic reaction to DPT vaccine but never to DT   Current Outpatient Prescriptions  on File Prior to Visit  Medication Sig Dispense Refill  . alprazolam (XANAX) 2 MG tablet TAKE 1 TABLET BY MOUTH EVERY DAY AS NEEDED FOR SLEEP 30 tablet 5  . betamethasone, augmented, (DIPROLENE) 0.05 % lotion Apply topically daily. qpply mixture to dry skin 2 times a day--1/4 betamethasone lotion with 3/4 eucerinelotion     . butalbital-aspirin-caffeine-codeine (FIORINAL WITH CODEINE) 50-325-40-30 MG capsule Take 1 capsule by mouth every 4 (four) hours as needed. 30 capsule 2  . Cholecalciferol (VITAMIN D3) 400 UNITS CAPS Take by mouth daily.      . ranitidine (ZANTAC) 150 MG capsule Take 150 mg by mouth 2 (two) times daily.    . vitamin E 400 UNIT capsule Take 400 Units by mouth daily.       No current facility-administered medications on file prior to visit.    Review of Systems  Constitutional: Negative for unusual diaphoresis or night sweats HENT: Negative for ear swelling or discharge Eyes: Negative for worsening visual haziness  Respiratory: Negative for choking and stridor.   Gastrointestinal: Negative for distension or worsening eructation Genitourinary: Negative for retention or change in urine volume.  Musculoskeletal: Negative for other MSK pain or swelling Skin: Negative for color change and worsening wound Neurological: Negative for tremors and numbness other than noted  Psychiatric/Behavioral: Negative for decreased concentration or agitation other than above   All other system neg  Objective:   Physical Exam BP 140/80   Pulse (!) 56   Temp 98.4 F (36.9 C) (Oral)   Resp 20   Wt 247 lb (112 kg)   SpO2 97%   BMI 38.69 kg/m  VS noted, tearfulm, emotional, mild ill Constitutional: Pt appears in no apparent distress HENT: Head: NCAT.  Right Ear: External ear normal.  Left Ear: External ear normal.  Bilat tm's with mild erythema.  Max sinus areas mild tender.  Pharynx with mild erythema, no exudate Eyes: . Pupils are equal, round, and reactive to light.  Conjunctivae and EOM are normal Neck: Normal range of motion. Neck supple.  Cardiovascular: Normal rate and regular rhythm.   Pulmonary/Chest: Effort normal and breath sounds without rales or wheezing.  Abd:  Soft, NT, ND, + BS Spine: diffuse low mid lumbar and bilat paravertebral lumbar tender without swelling or rash Neurological: Pt is alert. Not confused , motor grossly intact Skin: Skin is warm. No rash, no LE edema Psychiatric: Pt behavior is normal. No agitation.  No other new exam findings       Assessment & Plan:

## 2016-10-21 NOTE — Progress Notes (Signed)
Pre visit review using our clinic review tool, if applicable. No additional management support is needed unless otherwise documented below in the visit note. 

## 2016-10-21 NOTE — Patient Instructions (Signed)
Please take all new medication as prescribed - the antibiotic, and nasacort as directed  Please continue all other medications as before, and refills have been done if requested - the 2 pain medications, and gabapentin  Please have the pharmacy call with any other refills you may need.  Please keep your appointments with your specialists as you may have planned

## 2016-10-22 ENCOUNTER — Encounter: Payer: Self-pay | Admitting: Internal Medicine

## 2016-10-22 DIAGNOSIS — J309 Allergic rhinitis, unspecified: Secondary | ICD-10-CM

## 2016-10-22 HISTORY — DX: Allergic rhinitis, unspecified: J30.9

## 2016-10-22 NOTE — Assessment & Plan Note (Signed)
Mild, for otc nasacort asd,  to f/u any worsening symptoms or concerns 

## 2016-10-22 NOTE — Assessment & Plan Note (Signed)
Mild to mod, for antibx course,  to f/u any worsening symptoms or concerns 

## 2016-10-22 NOTE — Assessment & Plan Note (Signed)
Overall stable, for surgury soon followed by pain management jan 15, needs med refills for methadone/oxycodone x 1 mo, further meds per pain management

## 2017-01-04 DIAGNOSIS — M47816 Spondylosis without myelopathy or radiculopathy, lumbar region: Secondary | ICD-10-CM | POA: Diagnosis not present

## 2017-01-04 DIAGNOSIS — Z79891 Long term (current) use of opiate analgesic: Secondary | ICD-10-CM | POA: Diagnosis not present

## 2017-01-04 DIAGNOSIS — G8929 Other chronic pain: Secondary | ICD-10-CM | POA: Diagnosis not present

## 2017-01-12 ENCOUNTER — Telehealth: Payer: Self-pay | Admitting: *Deleted

## 2017-01-12 MED ORDER — ALPRAZOLAM 2 MG PO TABS: ORAL_TABLET | ORAL | 5 refills | Status: DC

## 2017-01-12 NOTE — Telephone Encounter (Signed)
Done hardcopy to Anna  

## 2017-01-12 NOTE — Telephone Encounter (Signed)
Rec'd fax pt requesting refill on his Alprazolam 2 mg. Last filled 11/06/16.../LMB

## 2017-01-12 NOTE — Telephone Encounter (Signed)
[facility]...Raechel Chute/lmb

## 2017-03-01 DIAGNOSIS — G8929 Other chronic pain: Secondary | ICD-10-CM | POA: Diagnosis not present

## 2017-03-01 DIAGNOSIS — M47816 Spondylosis without myelopathy or radiculopathy, lumbar region: Secondary | ICD-10-CM | POA: Diagnosis not present

## 2017-03-01 DIAGNOSIS — M961 Postlaminectomy syndrome, not elsewhere classified: Secondary | ICD-10-CM | POA: Diagnosis not present

## 2017-04-19 DIAGNOSIS — M47817 Spondylosis without myelopathy or radiculopathy, lumbosacral region: Secondary | ICD-10-CM | POA: Diagnosis not present

## 2017-06-13 DIAGNOSIS — Z981 Arthrodesis status: Secondary | ICD-10-CM | POA: Diagnosis not present

## 2017-06-13 DIAGNOSIS — G8929 Other chronic pain: Secondary | ICD-10-CM | POA: Diagnosis not present

## 2017-06-13 DIAGNOSIS — M545 Low back pain: Secondary | ICD-10-CM | POA: Diagnosis not present

## 2017-06-13 DIAGNOSIS — Z79899 Other long term (current) drug therapy: Secondary | ICD-10-CM | POA: Diagnosis not present

## 2017-07-16 ENCOUNTER — Other Ambulatory Visit: Payer: Self-pay | Admitting: Internal Medicine

## 2017-07-18 NOTE — Telephone Encounter (Signed)
Faxed

## 2017-07-18 NOTE — Telephone Encounter (Signed)
Done hardcopy to Shirron  

## 2017-07-19 DIAGNOSIS — Z79899 Other long term (current) drug therapy: Secondary | ICD-10-CM | POA: Diagnosis not present

## 2017-07-19 DIAGNOSIS — M5416 Radiculopathy, lumbar region: Secondary | ICD-10-CM | POA: Diagnosis not present

## 2017-07-19 DIAGNOSIS — G8929 Other chronic pain: Secondary | ICD-10-CM | POA: Diagnosis not present

## 2017-08-16 DIAGNOSIS — Z79899 Other long term (current) drug therapy: Secondary | ICD-10-CM | POA: Diagnosis not present

## 2017-08-16 DIAGNOSIS — M5030 Other cervical disc degeneration, unspecified cervical region: Secondary | ICD-10-CM | POA: Diagnosis not present

## 2017-08-16 DIAGNOSIS — M5137 Other intervertebral disc degeneration, lumbosacral region: Secondary | ICD-10-CM | POA: Diagnosis not present

## 2017-08-16 DIAGNOSIS — M5386 Other specified dorsopathies, lumbar region: Secondary | ICD-10-CM | POA: Diagnosis not present

## 2017-08-29 DIAGNOSIS — M5137 Other intervertebral disc degeneration, lumbosacral region: Secondary | ICD-10-CM | POA: Diagnosis not present

## 2017-08-29 DIAGNOSIS — M5386 Other specified dorsopathies, lumbar region: Secondary | ICD-10-CM | POA: Diagnosis not present

## 2017-08-29 DIAGNOSIS — M5030 Other cervical disc degeneration, unspecified cervical region: Secondary | ICD-10-CM | POA: Diagnosis not present

## 2017-09-26 DIAGNOSIS — M5137 Other intervertebral disc degeneration, lumbosacral region: Secondary | ICD-10-CM | POA: Diagnosis not present

## 2017-09-26 DIAGNOSIS — M5386 Other specified dorsopathies, lumbar region: Secondary | ICD-10-CM | POA: Diagnosis not present

## 2017-09-26 DIAGNOSIS — M5030 Other cervical disc degeneration, unspecified cervical region: Secondary | ICD-10-CM | POA: Diagnosis not present

## 2017-09-26 DIAGNOSIS — Z79899 Other long term (current) drug therapy: Secondary | ICD-10-CM | POA: Diagnosis not present

## 2017-10-10 ENCOUNTER — Other Ambulatory Visit: Payer: Self-pay | Admitting: Internal Medicine

## 2017-10-10 NOTE — Telephone Encounter (Signed)
Done erx 

## 2017-10-18 DIAGNOSIS — M5030 Other cervical disc degeneration, unspecified cervical region: Secondary | ICD-10-CM | POA: Diagnosis not present

## 2017-10-18 DIAGNOSIS — M5386 Other specified dorsopathies, lumbar region: Secondary | ICD-10-CM | POA: Diagnosis not present

## 2017-10-18 DIAGNOSIS — Z79899 Other long term (current) drug therapy: Secondary | ICD-10-CM | POA: Diagnosis not present

## 2017-10-18 DIAGNOSIS — M5137 Other intervertebral disc degeneration, lumbosacral region: Secondary | ICD-10-CM | POA: Diagnosis not present

## 2017-10-24 DIAGNOSIS — M4316 Spondylolisthesis, lumbar region: Secondary | ICD-10-CM | POA: Diagnosis not present

## 2017-11-17 DIAGNOSIS — Z79899 Other long term (current) drug therapy: Secondary | ICD-10-CM | POA: Diagnosis not present

## 2017-11-17 DIAGNOSIS — M5137 Other intervertebral disc degeneration, lumbosacral region: Secondary | ICD-10-CM | POA: Diagnosis not present

## 2017-12-14 DIAGNOSIS — M5030 Other cervical disc degeneration, unspecified cervical region: Secondary | ICD-10-CM | POA: Diagnosis not present

## 2017-12-14 DIAGNOSIS — Z79899 Other long term (current) drug therapy: Secondary | ICD-10-CM | POA: Diagnosis not present

## 2017-12-14 DIAGNOSIS — M961 Postlaminectomy syndrome, not elsewhere classified: Secondary | ICD-10-CM | POA: Diagnosis not present

## 2018-01-16 ENCOUNTER — Other Ambulatory Visit: Payer: Self-pay | Admitting: Nurse Practitioner

## 2018-01-16 DIAGNOSIS — M961 Postlaminectomy syndrome, not elsewhere classified: Secondary | ICD-10-CM

## 2018-01-16 DIAGNOSIS — M5137 Other intervertebral disc degeneration, lumbosacral region: Secondary | ICD-10-CM | POA: Diagnosis not present

## 2018-01-16 DIAGNOSIS — Z79899 Other long term (current) drug therapy: Secondary | ICD-10-CM | POA: Diagnosis not present

## 2018-01-17 ENCOUNTER — Other Ambulatory Visit: Payer: Self-pay | Admitting: Internal Medicine

## 2018-01-17 NOTE — Telephone Encounter (Signed)
Pt last seen dec 2017, no further xanax refills as needs OV according to office policy

## 2018-01-22 ENCOUNTER — Other Ambulatory Visit (HOSPITAL_COMMUNITY): Payer: Self-pay | Admitting: Nurse Practitioner

## 2018-01-22 DIAGNOSIS — M961 Postlaminectomy syndrome, not elsewhere classified: Secondary | ICD-10-CM

## 2018-01-26 ENCOUNTER — Ambulatory Visit (HOSPITAL_COMMUNITY): Payer: 59

## 2018-01-31 ENCOUNTER — Ambulatory Visit (HOSPITAL_COMMUNITY)
Admission: RE | Admit: 2018-01-31 | Discharge: 2018-01-31 | Disposition: A | Discharge status: Home / Self Care | Payer: 59 | Source: Ambulatory Visit | Attending: Nurse Practitioner | Admitting: Nurse Practitioner

## 2018-01-31 DIAGNOSIS — M5126 Other intervertebral disc displacement, lumbar region: Secondary | ICD-10-CM | POA: Insufficient documentation

## 2018-01-31 DIAGNOSIS — M961 Postlaminectomy syndrome, not elsewhere classified: Secondary | ICD-10-CM | POA: Diagnosis not present

## 2018-01-31 DIAGNOSIS — M545 Low back pain: Secondary | ICD-10-CM | POA: Diagnosis not present

## 2018-01-31 DIAGNOSIS — M48061 Spinal stenosis, lumbar region without neurogenic claudication: Secondary | ICD-10-CM | POA: Insufficient documentation

## 2018-02-22 DIAGNOSIS — Z79899 Other long term (current) drug therapy: Secondary | ICD-10-CM | POA: Diagnosis not present

## 2018-02-22 DIAGNOSIS — M5137 Other intervertebral disc degeneration, lumbosacral region: Secondary | ICD-10-CM | POA: Diagnosis not present

## 2018-02-22 DIAGNOSIS — M5386 Other specified dorsopathies, lumbar region: Secondary | ICD-10-CM | POA: Diagnosis not present

## 2018-02-22 DIAGNOSIS — M5030 Other cervical disc degeneration, unspecified cervical region: Secondary | ICD-10-CM | POA: Diagnosis not present

## 2018-03-28 DIAGNOSIS — M5137 Other intervertebral disc degeneration, lumbosacral region: Secondary | ICD-10-CM | POA: Diagnosis not present

## 2018-03-28 DIAGNOSIS — Z79899 Other long term (current) drug therapy: Secondary | ICD-10-CM | POA: Diagnosis not present

## 2018-04-17 DIAGNOSIS — M25512 Pain in left shoulder: Secondary | ICD-10-CM | POA: Diagnosis not present

## 2018-04-17 DIAGNOSIS — M5136 Other intervertebral disc degeneration, lumbar region: Secondary | ICD-10-CM | POA: Diagnosis not present

## 2018-04-17 DIAGNOSIS — M47816 Spondylosis without myelopathy or radiculopathy, lumbar region: Secondary | ICD-10-CM | POA: Diagnosis not present

## 2018-04-17 DIAGNOSIS — G8929 Other chronic pain: Secondary | ICD-10-CM | POA: Diagnosis not present

## 2018-04-17 DIAGNOSIS — M25552 Pain in left hip: Secondary | ICD-10-CM | POA: Diagnosis not present

## 2018-04-26 DIAGNOSIS — M5030 Other cervical disc degeneration, unspecified cervical region: Secondary | ICD-10-CM | POA: Diagnosis not present

## 2018-04-26 DIAGNOSIS — M9983 Other biomechanical lesions of lumbar region: Secondary | ICD-10-CM | POA: Diagnosis not present

## 2018-04-26 DIAGNOSIS — M5137 Other intervertebral disc degeneration, lumbosacral region: Secondary | ICD-10-CM | POA: Diagnosis not present

## 2018-04-26 DIAGNOSIS — Z79899 Other long term (current) drug therapy: Secondary | ICD-10-CM | POA: Diagnosis not present

## 2018-05-08 DIAGNOSIS — M25512 Pain in left shoulder: Secondary | ICD-10-CM | POA: Diagnosis not present

## 2018-05-08 DIAGNOSIS — G8929 Other chronic pain: Secondary | ICD-10-CM | POA: Diagnosis not present

## 2018-05-08 DIAGNOSIS — M719 Bursopathy, unspecified: Secondary | ICD-10-CM | POA: Diagnosis not present

## 2018-05-31 DIAGNOSIS — M5137 Other intervertebral disc degeneration, lumbosacral region: Secondary | ICD-10-CM | POA: Diagnosis not present

## 2018-05-31 DIAGNOSIS — Z79899 Other long term (current) drug therapy: Secondary | ICD-10-CM | POA: Diagnosis not present

## 2018-06-28 DIAGNOSIS — M5137 Other intervertebral disc degeneration, lumbosacral region: Secondary | ICD-10-CM | POA: Diagnosis not present

## 2018-06-28 DIAGNOSIS — Z79899 Other long term (current) drug therapy: Secondary | ICD-10-CM | POA: Diagnosis not present

## 2018-07-26 DIAGNOSIS — H2513 Age-related nuclear cataract, bilateral: Secondary | ICD-10-CM | POA: Diagnosis not present

## 2018-08-01 DIAGNOSIS — Z79899 Other long term (current) drug therapy: Secondary | ICD-10-CM | POA: Diagnosis not present

## 2018-08-01 DIAGNOSIS — M5137 Other intervertebral disc degeneration, lumbosacral region: Secondary | ICD-10-CM | POA: Diagnosis not present

## 2018-08-01 DIAGNOSIS — Z6835 Body mass index (BMI) 35.0-35.9, adult: Secondary | ICD-10-CM | POA: Diagnosis not present

## 2018-08-31 DIAGNOSIS — M545 Low back pain: Secondary | ICD-10-CM | POA: Diagnosis not present

## 2018-08-31 DIAGNOSIS — Z79899 Other long term (current) drug therapy: Secondary | ICD-10-CM | POA: Diagnosis not present

## 2018-08-31 DIAGNOSIS — G8929 Other chronic pain: Secondary | ICD-10-CM | POA: Diagnosis not present

## 2018-08-31 DIAGNOSIS — M5137 Other intervertebral disc degeneration, lumbosacral region: Secondary | ICD-10-CM | POA: Diagnosis not present

## 2018-10-01 DIAGNOSIS — M5030 Other cervical disc degeneration, unspecified cervical region: Secondary | ICD-10-CM | POA: Diagnosis not present

## 2018-10-01 DIAGNOSIS — Z79899 Other long term (current) drug therapy: Secondary | ICD-10-CM | POA: Diagnosis not present

## 2018-10-01 DIAGNOSIS — M5137 Other intervertebral disc degeneration, lumbosacral region: Secondary | ICD-10-CM | POA: Diagnosis not present

## 2018-10-01 DIAGNOSIS — Z6835 Body mass index (BMI) 35.0-35.9, adult: Secondary | ICD-10-CM | POA: Diagnosis not present

## 2018-10-24 DIAGNOSIS — M47816 Spondylosis without myelopathy or radiculopathy, lumbar region: Secondary | ICD-10-CM | POA: Diagnosis not present

## 2018-10-24 DIAGNOSIS — M5136 Other intervertebral disc degeneration, lumbar region: Secondary | ICD-10-CM | POA: Diagnosis not present

## 2018-10-24 DIAGNOSIS — M4316 Spondylolisthesis, lumbar region: Secondary | ICD-10-CM | POA: Diagnosis not present

## 2018-10-30 DIAGNOSIS — M5386 Other specified dorsopathies, lumbar region: Secondary | ICD-10-CM | POA: Diagnosis not present

## 2018-10-30 DIAGNOSIS — M5030 Other cervical disc degeneration, unspecified cervical region: Secondary | ICD-10-CM | POA: Diagnosis not present

## 2018-10-30 DIAGNOSIS — M5137 Other intervertebral disc degeneration, lumbosacral region: Secondary | ICD-10-CM | POA: Diagnosis not present

## 2018-10-30 DIAGNOSIS — Z79899 Other long term (current) drug therapy: Secondary | ICD-10-CM | POA: Diagnosis not present

## 2018-11-12 IMAGING — MR MR LUMBAR SPINE W/O CM
4 of 5 series · 15 of 48 positions shown · non-contrast
Comparison: Prior MRI from 08/08/2016.

CLINICAL DATA: Initial evaluation for chronic low back pain,
bilateral lower extremity radiculopathy, right greater than left.
History prior surgery.

EXAM:
MRI LUMBAR SPINE WITHOUT CONTRAST
TECHNIQUE: Multiplanar, multisequence MR imaging of the lumbar spine was
performed. No intravenous contrast was administered.

[Series 3: T2 · sagittal · 4.0mm · 0.72mm/px · 6 of 13 slices shown (1 of 2)]
[im 1/13]
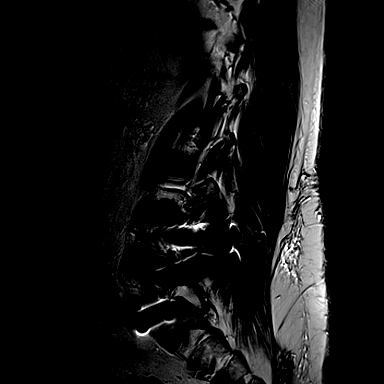
[im 3/13]
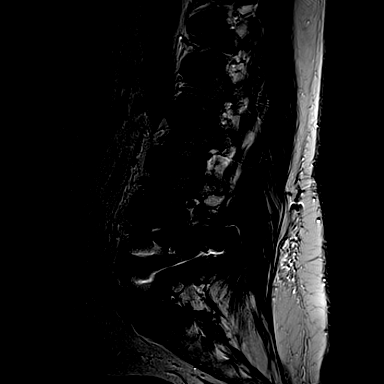
[im 5/13]
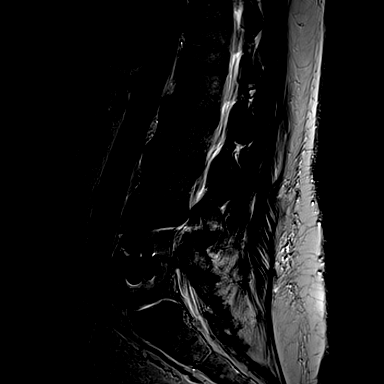
[im 8/13]
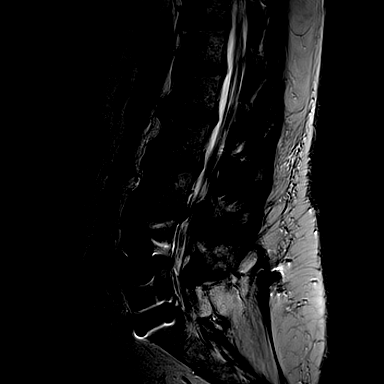
[im 10/13]
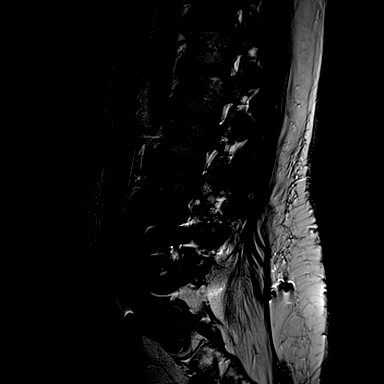
[im 13/13]
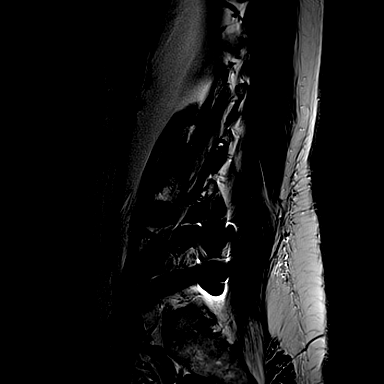

[Series 4: T1 · sagittal · 4.0mm · 0.36mm/px · 3 of 13 slices shown (1 of 2)]
[im 1/13]
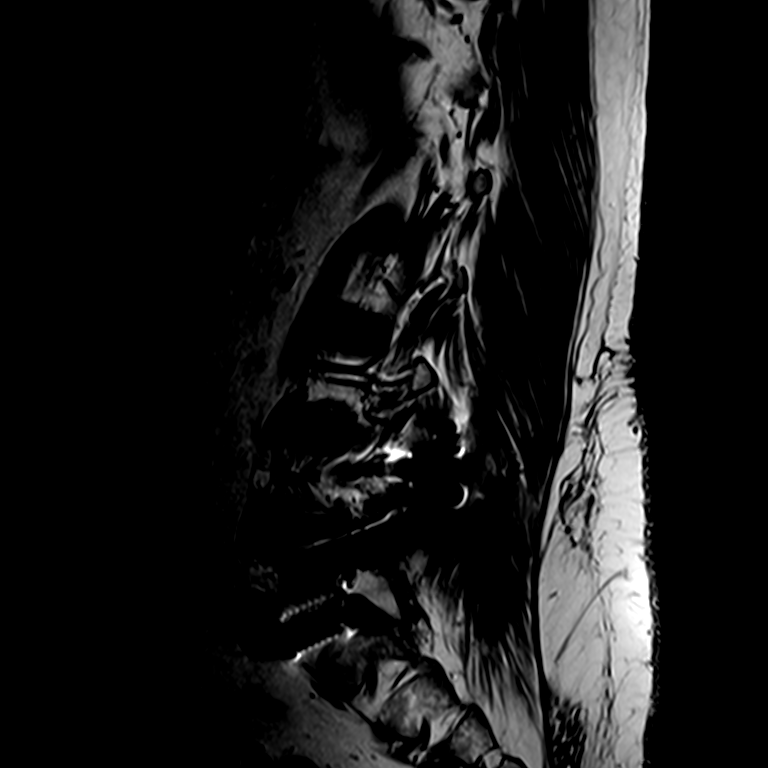
[im 7/13]
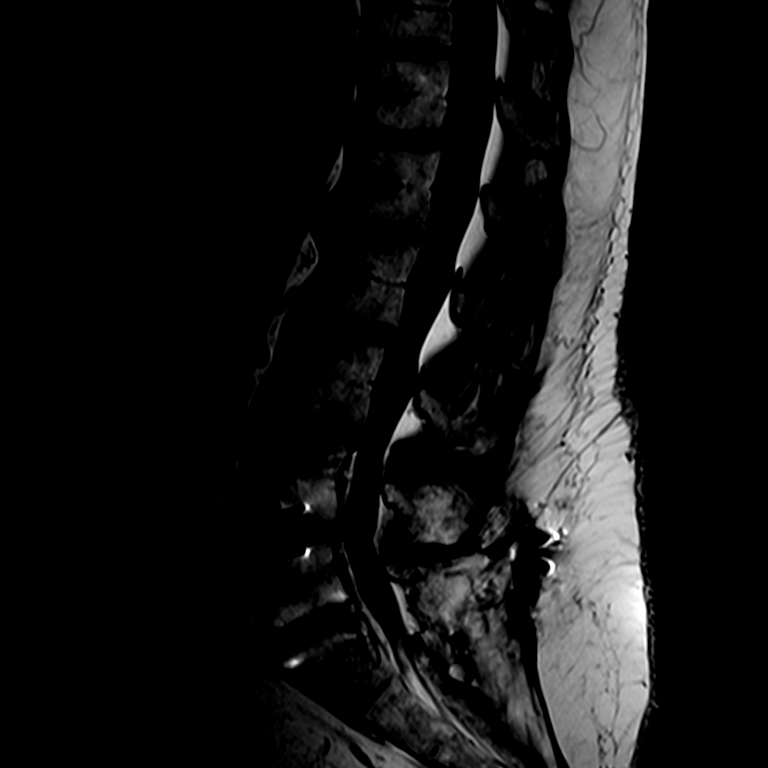
[im 13/13]
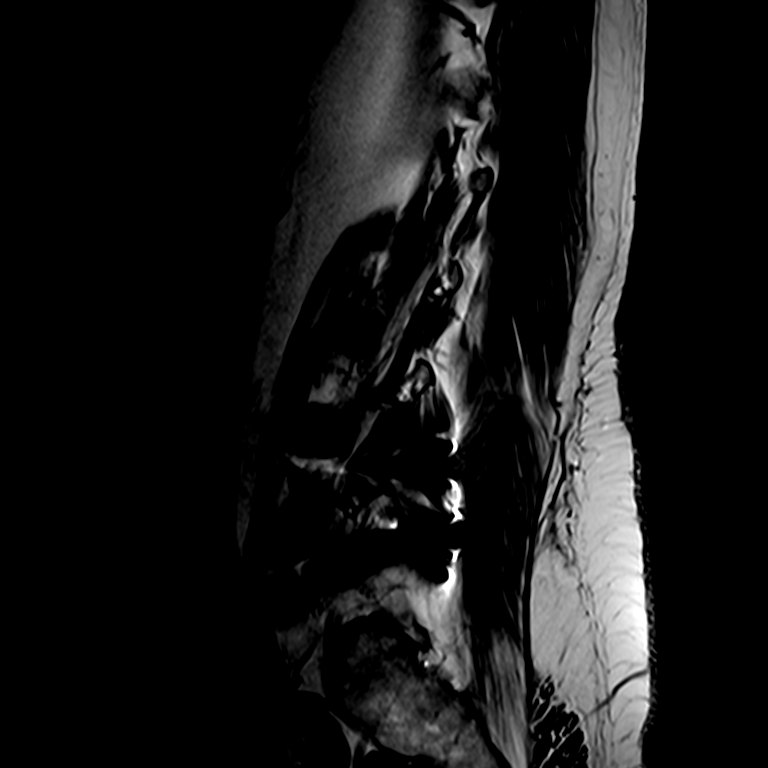

[Series 6: T2 · axial · 4.0mm · 0.21mm/px · z∈[-107,+28]mm · 3 of 41 slices shown (2 of 2)]
[im 6/41]
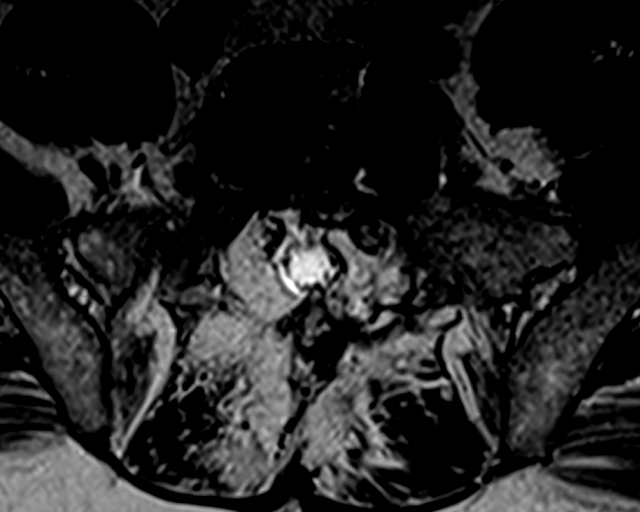
[im 22/41]
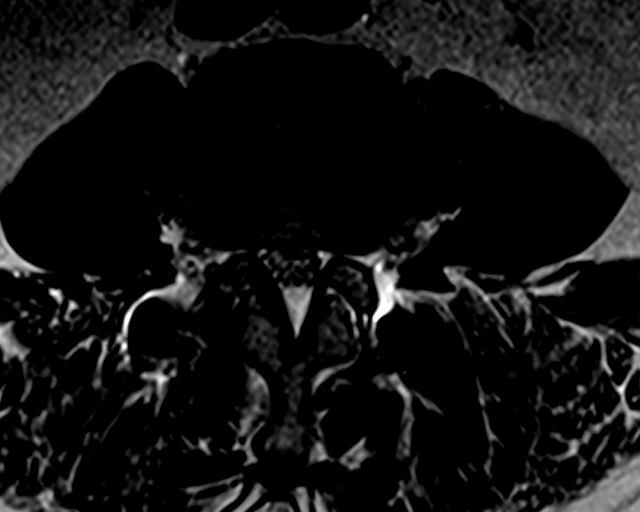
[im 35/41]
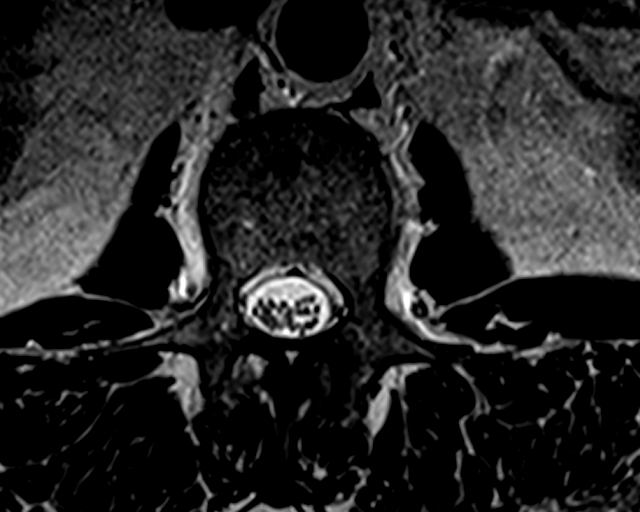

[Series 7: T1 · axial · 4.0mm · 0.23mm/px · z∈[-107,+29]mm · 3 of 41 slices shown (2 of 2)]
[im 6/41]
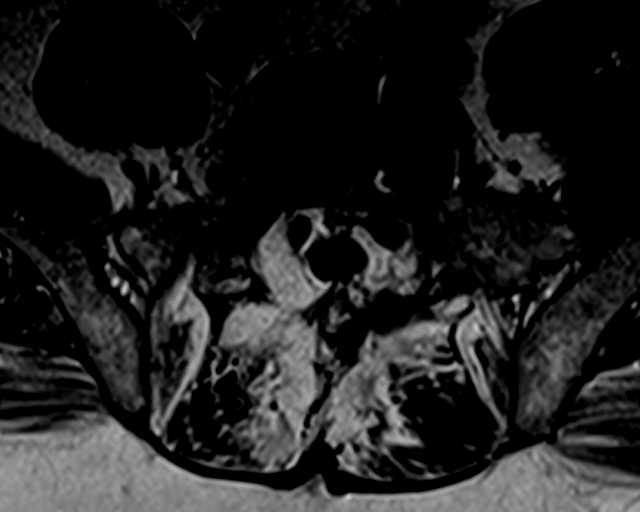
[im 22/41]
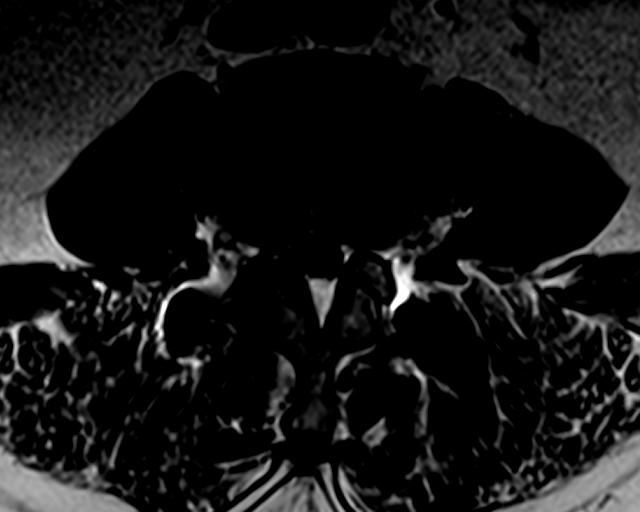
[im 35/41]
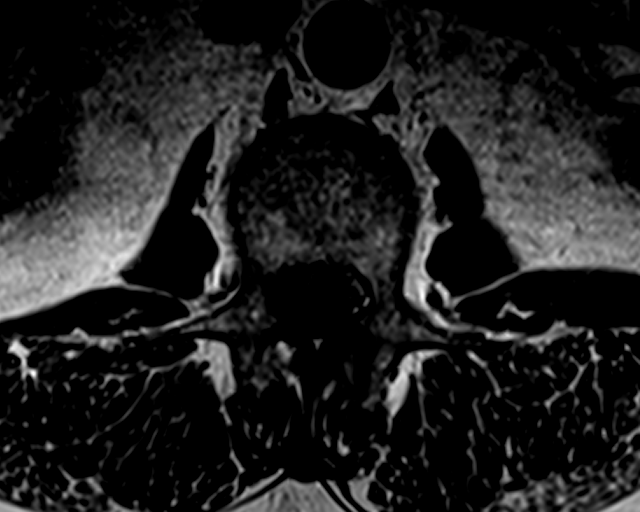

[15 of 48 positions shown; findings below may reference images not displayed]

FINDINGS: Segmentation: Normal segmentation. Lowest well-formed disc labeled
the L5-S1 level. Same numbering system is employed as on previous
exam.

Alignment: 4 mm retrolisthesis of L3 on L4, similar to previous.
Trace retrolisthesis of L2 on L3 also similar. Vertebral bodies
otherwise normally aligned.

Vertebrae: Vertebral body heights are maintained without evidence
for acute or chronic fracture. Susceptibility artifact from prior
posterior and interbody fusion present at L4-5 and L5-S1. Bone
marrow signal intensity within normal limits. No discrete or
worrisome osseous lesions. No abnormal marrow edema.

Conus medullaris and cauda equina: Conus extends to the L1 level.
Conus and cauda equina appear normal.

Paraspinal and other soft tissues: Chronic postoperative changes
present within the lower posterior paraspinous soft tissues.
Visualized paraspinous soft tissues demonstrate no acute
abnormality. Partially visualized visceral structures within normal
limits.

Disc levels:

T11-12: Unremarkable.

T12-L1: Seen only on sagittal projection. Mild diffuse disc bulge
with disc desiccation. No stenosis.

L1-2: Diffuse degenerative disc bulge with disc desiccation. Mild
facet and ligament flavum hypertrophy. Mild spinal stenosis, stable.
Foramina remain patent.

L2-3: Trace retrolisthesis. Diffuse disc bulge with disc
desiccation. Mild to moderate facet and ligamentum flavum
hypertrophy, slightly progressed from previous. Slightly worsened
mild spinal stenosis. No significant foraminal encroachment.

L3-4: 4 mm retrolisthesis of L3 on L4. Diffuse disc bulge with
moderate facet and ligamentum flavum hypertrophy. Resultant moderate
canal with bilateral subarticular stenosis, progressed from
previous. Mild bilateral L3 foraminal narrowing, grossly similar.

L4-5: Postoperative changes from previous posterior and interbody
fusion. Residual reactive endplate changes with endplate osseous
ridging, greater on the left. Moderate facet and ligament flavum
hypertrophy. Improved thecal sac patency from prior with relatively
mild stenosis now seen. Moderate left lateral recess narrowing, also
improved from previous. Mild to moderate left L4 foraminal narrowing
also improved.

L5-S1: Prior interbody fusion with wide posterior decompression and
facetectomy. No residual stenosis.
IMPRESSION: 1. Postoperative changes from interval posterior and interbody
fusion at L4-5 with improved mild canal with moderate left lateral
recess stenosis. Mild to moderate left L4 foraminal narrowing also
improved.
2. Adjacent segment disease with progressive disc bulge and facet
hypertrophy at L3-4 with worsened moderate spinal stenosis.

## 2018-11-22 DIAGNOSIS — M5030 Other cervical disc degeneration, unspecified cervical region: Secondary | ICD-10-CM | POA: Diagnosis not present

## 2018-11-22 DIAGNOSIS — M5386 Other specified dorsopathies, lumbar region: Secondary | ICD-10-CM | POA: Diagnosis not present

## 2018-11-22 DIAGNOSIS — Z87891 Personal history of nicotine dependence: Secondary | ICD-10-CM | POA: Diagnosis not present

## 2018-11-22 DIAGNOSIS — F17200 Nicotine dependence, unspecified, uncomplicated: Secondary | ICD-10-CM | POA: Diagnosis not present

## 2018-11-22 DIAGNOSIS — Z79899 Other long term (current) drug therapy: Secondary | ICD-10-CM | POA: Diagnosis not present

## 2018-11-22 DIAGNOSIS — M5137 Other intervertebral disc degeneration, lumbosacral region: Secondary | ICD-10-CM | POA: Diagnosis not present

## 2018-12-27 DIAGNOSIS — M5386 Other specified dorsopathies, lumbar region: Secondary | ICD-10-CM | POA: Diagnosis not present

## 2018-12-27 DIAGNOSIS — Z79899 Other long term (current) drug therapy: Secondary | ICD-10-CM | POA: Diagnosis not present

## 2018-12-27 DIAGNOSIS — M5137 Other intervertebral disc degeneration, lumbosacral region: Secondary | ICD-10-CM | POA: Diagnosis not present

## 2018-12-27 DIAGNOSIS — M5030 Other cervical disc degeneration, unspecified cervical region: Secondary | ICD-10-CM | POA: Diagnosis not present

## 2019-01-25 DIAGNOSIS — M5137 Other intervertebral disc degeneration, lumbosacral region: Secondary | ICD-10-CM | POA: Diagnosis not present

## 2019-01-25 DIAGNOSIS — M5030 Other cervical disc degeneration, unspecified cervical region: Secondary | ICD-10-CM | POA: Diagnosis not present

## 2019-01-25 DIAGNOSIS — M5386 Other specified dorsopathies, lumbar region: Secondary | ICD-10-CM | POA: Diagnosis not present

## 2019-01-25 DIAGNOSIS — Z79899 Other long term (current) drug therapy: Secondary | ICD-10-CM | POA: Diagnosis not present

## 2019-02-25 DIAGNOSIS — M5137 Other intervertebral disc degeneration, lumbosacral region: Secondary | ICD-10-CM | POA: Diagnosis not present

## 2019-02-25 DIAGNOSIS — Z79899 Other long term (current) drug therapy: Secondary | ICD-10-CM | POA: Diagnosis not present

## 2019-02-25 DIAGNOSIS — M5030 Other cervical disc degeneration, unspecified cervical region: Secondary | ICD-10-CM | POA: Diagnosis not present

## 2019-02-25 DIAGNOSIS — M5386 Other specified dorsopathies, lumbar region: Secondary | ICD-10-CM | POA: Diagnosis not present

## 2019-03-25 DIAGNOSIS — M5030 Other cervical disc degeneration, unspecified cervical region: Secondary | ICD-10-CM | POA: Diagnosis not present

## 2019-03-25 DIAGNOSIS — M5386 Other specified dorsopathies, lumbar region: Secondary | ICD-10-CM | POA: Diagnosis not present

## 2019-03-25 DIAGNOSIS — Z79899 Other long term (current) drug therapy: Secondary | ICD-10-CM | POA: Diagnosis not present

## 2019-03-25 DIAGNOSIS — M5137 Other intervertebral disc degeneration, lumbosacral region: Secondary | ICD-10-CM | POA: Diagnosis not present

## 2022-04-28 ENCOUNTER — Encounter: Payer: Self-pay | Admitting: Urology

## 2022-04-28 ENCOUNTER — Ambulatory Visit (INDEPENDENT_AMBULATORY_CARE_PROVIDER_SITE_OTHER): Payer: 59 | Admitting: Urology

## 2022-04-28 VITALS — BP 124/75 | HR 88

## 2022-04-28 DIAGNOSIS — R109 Unspecified abdominal pain: Secondary | ICD-10-CM | POA: Diagnosis not present

## 2022-04-28 DIAGNOSIS — Z87442 Personal history of urinary calculi: Secondary | ICD-10-CM

## 2022-04-28 LAB — MICROSCOPIC EXAMINATION
Renal Epithel, UA: NONE SEEN /hpf
WBC, UA: NONE SEEN /hpf (ref 0–5)

## 2022-04-28 LAB — URINALYSIS, ROUTINE W REFLEX MICROSCOPIC
Bilirubin, UA: NEGATIVE
Glucose, UA: NEGATIVE
Leukocytes,UA: NEGATIVE
Nitrite, UA: NEGATIVE
Specific Gravity, UA: 1.025 (ref 1.005–1.030)
Urobilinogen, Ur: 1 mg/dL (ref 0.2–1.0)
pH, UA: 6 (ref 5.0–7.5)

## 2022-04-28 NOTE — H&P (View-Only) (Signed)
Assessment: 1. Bilateral flank pain   2. History of nephrolithiasis     Plan: Schedule for CT renal stone protocol at [facility] in the next week. We will contact him with results.  Chief Complaint:  Chief Complaint  Patient presents with   Flank Pain    History of Present Illness:  Adrian Ward is a 56 y.o. year old male who is seen in consultation from Corwin Levins, MD for evaluation of bilateral flank pain.  He reports onset of symptoms approximately 1-2 months ago.  He has fairly constant pain in bilateral flank/low back area.  He has a prior history of kidney stones and underwent lithotripsy approximately 15 years ago.  No recent imaging.  No fevers, chills, nausea, or vomiting.  No dysuria or gross hematuria.  He has chronic back pain and takes pain medication daily.  Past Medical History:  Past Medical History:  Diagnosis Date   Allergic rhinitis 10/22/2016   Cervical arthritis    Cervical disc disease    Chronic back pain    GERD (gastroesophageal reflux disease)    Headache(784.0)    Hyperlipidemia    Hypertriglyceridemia    Hypogonadism in male 08/07/2014    Past Surgical History:  Past Surgical History:  Procedure Laterality Date   EPIDURAL BLOCK INJECTION     dr Channing Mutters   LUMBAR FUSION     LUMBAR LAMINECTOMY     MOUTH SURGERY  1997    Allergies:  Allergies  Allergen Reactions   Pertussis Vaccines     Had an allergic reaction to DPT vaccine but never to DT   Meloxicam Palpitations    Family History:  Family History  Problem Relation Age of Onset   Cancer Mother        breast/ metastatic   Breast cancer Mother    Hypertension Father    Liver disease Father    Alcohol abuse Father    Colon cancer Neg Hx    Rectal cancer Neg Hx    Stomach cancer Neg Hx    Esophageal cancer Neg Hx     Social History:  Social History   Tobacco Use   Smoking status: Every Day    Packs/day: 0.50    Types: Cigarettes   Smokeless tobacco: Never   Substance Use Topics   Alcohol use: No    Alcohol/week: 0.0 standard drinks of alcohol   Drug use: No    Review of symptoms:  Constitutional:  Negative for unexplained weight loss, night sweats, fever, chills ENT:  Negative for nose bleeds, sinus pain, painful swallowing CV:  Negative for chest pain, shortness of breath, exercise intolerance, palpitations, loss of consciousness Resp:  Negative for cough, wheezing, shortness of breath GI:  Negative for nausea, vomiting, diarrhea, bloody stools GU:  Positives noted in HPI; otherwise negative for gross hematuria, dysuria, urinary incontinence Neuro:  Negative for seizures, poor balance, limb weakness, slurred speech Psych:  Negative for lack of energy, depression, anxiety Endocrine:  Negative for polydipsia, polyuria, symptoms of hypoglycemia (dizziness, hunger, sweating) Hematologic:  Negative for anemia, purpura, petechia, prolonged or excessive bleeding, use of anticoagulants  Allergic:  Negative for difficulty breathing or choking as a result of exposure to anything; no shellfish allergy; no allergic response (rash/itch) to materials, foods  Physical exam: BP 124/75   Pulse 88  GENERAL APPEARANCE:  Well appearing, well developed, well nourished, NAD HEENT: Atraumatic, Normocephalic, oropharynx clear. NECK: Supple without lymphadenopathy or thyromegaly. LUNGS: Clear to auscultation bilaterally.  HEART: Regular Rate and Rhythm without murmurs, gallops, or rubs. ABDOMEN: Soft, non-tender, No Masses. EXTREMITIES: Moves all extremities well.  Without clubbing, cyanosis, or edema. NEUROLOGIC:  Alert and oriented x 3, normal gait, CN II-XII grossly intact.  MENTAL STATUS:  Appropriate. BACK:  Non-tender to palpation.  No CVAT SKIN:  Warm, dry and intact.    Results: U/A:  0-2 RBC, few bacteria

## 2022-04-28 NOTE — Progress Notes (Signed)
 Assessment: 1. Bilateral flank pain   2. History of nephrolithiasis     Plan: Schedule for CT renal stone protocol at [facility] in the next week. We will contact him with results.  Chief Complaint:  Chief Complaint  Patient presents with   Flank Pain    History of Present Illness:  Adrian Ward is a 56 y.o. year old male who is seen in consultation from [name], James W, MD for evaluation of bilateral flank pain.  He reports onset of symptoms approximately 1-2 months ago.  He has fairly constant pain in bilateral flank/low back area.  He has a prior history of kidney stones and underwent lithotripsy approximately 15 years ago.  No recent imaging.  No fevers, chills, nausea, or vomiting.  No dysuria or gross hematuria.  He has chronic back pain and takes pain medication daily.  Past Medical History:  Past Medical History:  Diagnosis Date   Allergic rhinitis 10/22/2016   Cervical arthritis    Cervical disc disease    Chronic back pain    GERD (gastroesophageal reflux disease)    Headache(784.0)    Hyperlipidemia    Hypertriglyceridemia    Hypogonadism in male 08/07/2014    Past Surgical History:  Past Surgical History:  Procedure Laterality Date   EPIDURAL BLOCK INJECTION     dr roy   LUMBAR FUSION     LUMBAR LAMINECTOMY     MOUTH SURGERY  1997    Allergies:  Allergies  Allergen Reactions   Pertussis Vaccines     Had an allergic reaction to DPT vaccine but never to DT   Meloxicam Palpitations    Family History:  Family History  Problem Relation Age of Onset   Cancer Mother        breast/ metastatic   Breast cancer Mother    Hypertension Father    Liver disease Father    Alcohol abuse Father    Colon cancer Neg Hx    Rectal cancer Neg Hx    Stomach cancer Neg Hx    Esophageal cancer Neg Hx     Social History:  Social History   Tobacco Use   Smoking status: Every Day    Packs/day: 0.50    Types: Cigarettes   Smokeless tobacco: Never   Substance Use Topics   Alcohol use: No    Alcohol/week: 0.0 standard drinks of alcohol   Drug use: No    Review of symptoms:  Constitutional:  Negative for unexplained weight loss, night sweats, fever, chills ENT:  Negative for nose bleeds, sinus pain, painful swallowing CV:  Negative for chest pain, shortness of breath, exercise intolerance, palpitations, loss of consciousness Resp:  Negative for cough, wheezing, shortness of breath GI:  Negative for nausea, vomiting, diarrhea, bloody stools GU:  Positives noted in HPI; otherwise negative for gross hematuria, dysuria, urinary incontinence Neuro:  Negative for seizures, poor balance, limb weakness, slurred speech Psych:  Negative for lack of energy, depression, anxiety Endocrine:  Negative for polydipsia, polyuria, symptoms of hypoglycemia (dizziness, hunger, sweating) Hematologic:  Negative for anemia, purpura, petechia, prolonged or excessive bleeding, use of anticoagulants  Allergic:  Negative for difficulty breathing or choking as a result of exposure to anything; no shellfish allergy; no allergic response (rash/itch) to materials, foods  Physical exam: BP 124/75   Pulse 88  GENERAL APPEARANCE:  Well appearing, well developed, well nourished, NAD HEENT: Atraumatic, Normocephalic, oropharynx clear. NECK: Supple without lymphadenopathy or thyromegaly. LUNGS: Clear to auscultation bilaterally.   HEART: Regular Rate and Rhythm without murmurs, gallops, or rubs. ABDOMEN: Soft, non-tender, No Masses. EXTREMITIES: Moves all extremities well.  Without clubbing, cyanosis, or edema. NEUROLOGIC:  Alert and oriented x 3, normal gait, CN II-XII grossly intact.  MENTAL STATUS:  Appropriate. BACK:  Non-tender to palpation.  No CVAT SKIN:  Warm, dry and intact.    Results: U/A:  0-2 RBC, few bacteria

## 2022-05-12 ENCOUNTER — Ambulatory Visit (HOSPITAL_COMMUNITY)
Admission: RE | Admit: 2022-05-12 | Discharge: 2022-05-12 | Disposition: A | Discharge status: Home / Self Care | Payer: 59 | Source: Ambulatory Visit | Attending: Urology | Admitting: Urology

## 2022-05-12 DIAGNOSIS — Z87442 Personal history of urinary calculi: Secondary | ICD-10-CM | POA: Insufficient documentation

## 2022-05-12 DIAGNOSIS — R109 Unspecified abdominal pain: Secondary | ICD-10-CM | POA: Insufficient documentation

## 2022-05-16 ENCOUNTER — Other Ambulatory Visit: Payer: Self-pay | Admitting: Urology

## 2022-05-16 DIAGNOSIS — N201 Calculus of ureter: Secondary | ICD-10-CM

## 2022-05-19 ENCOUNTER — Other Ambulatory Visit: Payer: Self-pay

## 2022-05-19 ENCOUNTER — Ambulatory Visit (HOSPITAL_COMMUNITY)
Admission: RE | Admit: 2022-05-19 | Discharge: 2022-05-19 | Disposition: A | Discharge status: Home / Self Care | Payer: 59 | Source: Ambulatory Visit | Attending: Urology | Admitting: Urology

## 2022-05-19 DIAGNOSIS — N201 Calculus of ureter: Secondary | ICD-10-CM | POA: Diagnosis present

## 2022-05-23 ENCOUNTER — Encounter (HOSPITAL_COMMUNITY)
Admission: RE | Admit: 2022-05-23 | Discharge: 2022-05-23 | Disposition: A | Discharge status: Home / Self Care | Payer: 59 | Source: Ambulatory Visit | Attending: Urology | Admitting: Urology

## 2022-05-24 ENCOUNTER — Other Ambulatory Visit: Payer: Self-pay

## 2022-05-24 ENCOUNTER — Ambulatory Visit (HOSPITAL_COMMUNITY)
Admission: RE | Admit: 2022-05-24 | Discharge: 2022-05-24 | Disposition: A | Discharge status: Home / Self Care | Payer: 59 | Source: Intra-hospital | Attending: Urology | Admitting: Urology

## 2022-05-24 ENCOUNTER — Ambulatory Visit (HOSPITAL_COMMUNITY): Payer: 59

## 2022-05-24 ENCOUNTER — Encounter (HOSPITAL_COMMUNITY): Admission: RE | Disposition: A | Discharge status: Home / Self Care | Payer: Self-pay | Attending: Urology

## 2022-05-24 ENCOUNTER — Encounter (HOSPITAL_COMMUNITY): Payer: Self-pay | Admitting: Urology

## 2022-05-24 DIAGNOSIS — N202 Calculus of kidney with calculus of ureter: Secondary | ICD-10-CM | POA: Diagnosis not present

## 2022-05-24 DIAGNOSIS — G8929 Other chronic pain: Secondary | ICD-10-CM | POA: Diagnosis not present

## 2022-05-24 DIAGNOSIS — M549 Dorsalgia, unspecified: Secondary | ICD-10-CM | POA: Insufficient documentation

## 2022-05-24 DIAGNOSIS — R109 Unspecified abdominal pain: Secondary | ICD-10-CM | POA: Diagnosis not present

## 2022-05-24 DIAGNOSIS — F17210 Nicotine dependence, cigarettes, uncomplicated: Secondary | ICD-10-CM | POA: Insufficient documentation

## 2022-05-24 DIAGNOSIS — Z87442 Personal history of urinary calculi: Secondary | ICD-10-CM | POA: Diagnosis not present

## 2022-05-24 DIAGNOSIS — N201 Calculus of ureter: Secondary | ICD-10-CM

## 2022-05-24 SURGERY — LITHOTRIPSY, ESWL
Anesthesia: General | Laterality: Left

## 2022-05-24 MED ORDER — DIPHENHYDRAMINE HCL 25 MG PO CAPS
ORAL_CAPSULE | ORAL | Status: AC
  Administered 2022-05-24: 25 mg via ORAL
  Filled 2022-05-24: qty 1

## 2022-05-24 MED ORDER — DIAZEPAM 5 MG PO TABS: 10.0000 mg | ORAL_TABLET | Freq: Once | ORAL | Status: AC

## 2022-05-24 MED ORDER — DIPHENHYDRAMINE HCL 25 MG PO CAPS: 25.0000 mg | ORAL_CAPSULE | ORAL | Status: AC

## 2022-05-24 MED ORDER — SODIUM CHLORIDE 0.9 % IV SOLN: INTRAVENOUS | Status: DC

## 2022-05-24 MED ORDER — DIAZEPAM 5 MG PO TABS
ORAL_TABLET | ORAL | Status: AC
  Administered 2022-05-24: 10 mg via ORAL
  Filled 2022-05-24: qty 2

## 2022-05-24 NOTE — Progress Notes (Signed)
Per Dr. Pete Glatter KUB showed pt passed stones. Pt was given pre op meds, Pt informed can not drive for 29NLG. Pt verbalizes understanding.

## 2022-05-24 NOTE — Interval H&P Note (Signed)
History and Physical Interval Note:  05/24/2022 11:01 AM  I reviewed the KUB from this morning.  The previously noted calcifications in the left pelvis are no longer seen.  The patient reported an episode of pain yesterday with associated dysuria.  No pain this morning.  He has not been straining his urine on a regular basis. Given the findings on KUB with no obvious ureteral calculi present, the ESL will be cancelled.   The patient is in agreement with plan.   Adrian Ward

## 2022-06-07 ENCOUNTER — Ambulatory Visit: Payer: Medicare Other | Admitting: Physician Assistant

## 2022-06-17 ENCOUNTER — Ambulatory Visit: Payer: Medicare Other | Admitting: Physician Assistant
